# Patient Record
Sex: Male | Born: 1950 | Race: White | Hispanic: No | Marital: Married | Smoking: Current every day smoker
Health system: Southern US, Community
[De-identification: ages and names within clinical notes are randomized; demographics above are authoritative.]

## PROBLEM LIST (undated history)

## (undated) DIAGNOSIS — N200 Calculus of kidney: Secondary | ICD-10-CM

## (undated) DIAGNOSIS — M48061 Spinal stenosis, lumbar region without neurogenic claudication: Secondary | ICD-10-CM

## (undated) DIAGNOSIS — J410 Simple chronic bronchitis: Secondary | ICD-10-CM

## (undated) DIAGNOSIS — F988 Other specified behavioral and emotional disorders with onset usually occurring in childhood and adolescence: Secondary | ICD-10-CM

## (undated) DIAGNOSIS — G709 Myoneural disorder, unspecified: Secondary | ICD-10-CM

## (undated) DIAGNOSIS — F419 Anxiety disorder, unspecified: Secondary | ICD-10-CM

## (undated) DIAGNOSIS — M1990 Unspecified osteoarthritis, unspecified site: Secondary | ICD-10-CM

## (undated) DIAGNOSIS — M5432 Sciatica, left side: Secondary | ICD-10-CM

## (undated) DIAGNOSIS — Z87442 Personal history of urinary calculi: Secondary | ICD-10-CM

## (undated) DIAGNOSIS — N189 Chronic kidney disease, unspecified: Secondary | ICD-10-CM

## (undated) DIAGNOSIS — R200 Anesthesia of skin: Secondary | ICD-10-CM

## (undated) DIAGNOSIS — N401 Enlarged prostate with lower urinary tract symptoms: Secondary | ICD-10-CM

## (undated) DIAGNOSIS — M722 Plantar fascial fibromatosis: Secondary | ICD-10-CM

## (undated) DIAGNOSIS — J439 Emphysema, unspecified: Secondary | ICD-10-CM

## (undated) DIAGNOSIS — E785 Hyperlipidemia, unspecified: Secondary | ICD-10-CM

## (undated) HISTORY — PX: OTHER SURGICAL HISTORY: SHX169

## (undated) HISTORY — PX: EXTRACORPOREAL SHOCK WAVE LITHOTRIPSY: SHX1557

## (undated) HISTORY — PX: CATARACT EXTRACTION W/ INTRAOCULAR LENS  IMPLANT, BILATERAL: SHX1307

---

## 2002-11-07 ENCOUNTER — Encounter (INDEPENDENT_AMBULATORY_CARE_PROVIDER_SITE_OTHER): Payer: Self-pay | Admitting: Specialist

## 2002-11-07 ENCOUNTER — Ambulatory Visit (HOSPITAL_COMMUNITY): Admission: RE | Admit: 2002-11-07 | Discharge: 2002-11-07 | Payer: Self-pay | Admitting: *Deleted

## 2009-08-25 ENCOUNTER — Emergency Department (HOSPITAL_COMMUNITY): Admission: EM | Admit: 2009-08-25 | Discharge: 2009-08-26 | Payer: Self-pay | Admitting: Emergency Medicine

## 2010-10-04 HISTORY — PX: KNEE ARTHROSCOPY: SHX127

## 2010-10-04 IMAGING — CT CT ABDOMEN W/O CM
2 of 3 series · 17 of 32 positions shown, 19 images · non-contrast
Comparison: None.

CT ABDOMEN

CLINICAL DATA: Right lower quadrant pain, nausea.

CT OF THE ABDOMEN AND PELVIS WITHOUT CONTRAST (CT UROGRAM)
TECHNIQUE: Multidetector CT imaging was performed through the
abdomen and pelvis to include the urinary tract.

[Series 4: lung windows · axial · 0.71mm/px · z∈[+1223,+1298]mm · 14 of 18 slices shown, 16 images]
[im 2/18  soft-tissue]
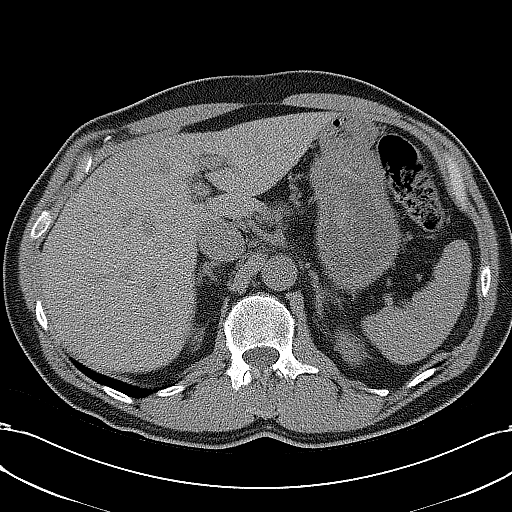
[im 2/18  bone]
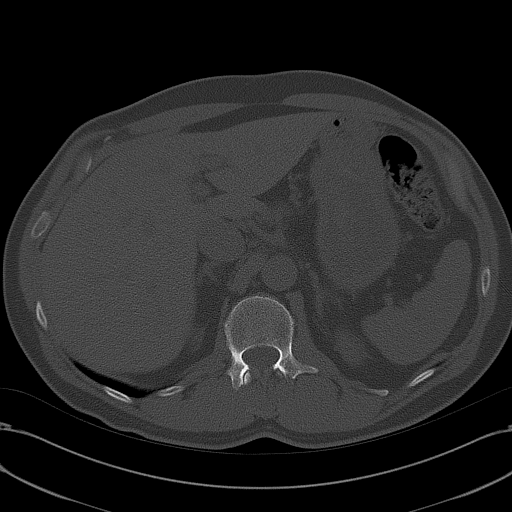
[im 3/18  soft-tissue]
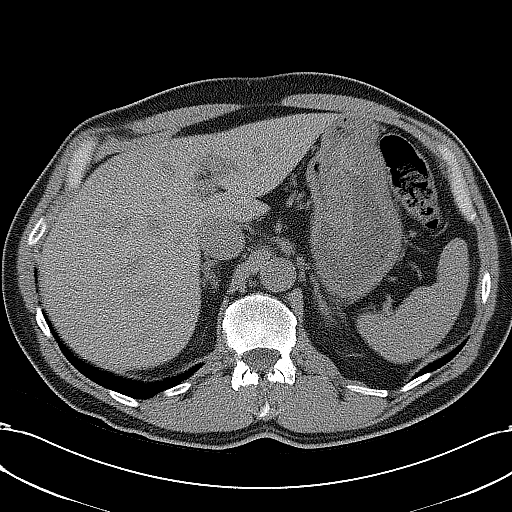
[im 4/18  soft-tissue]
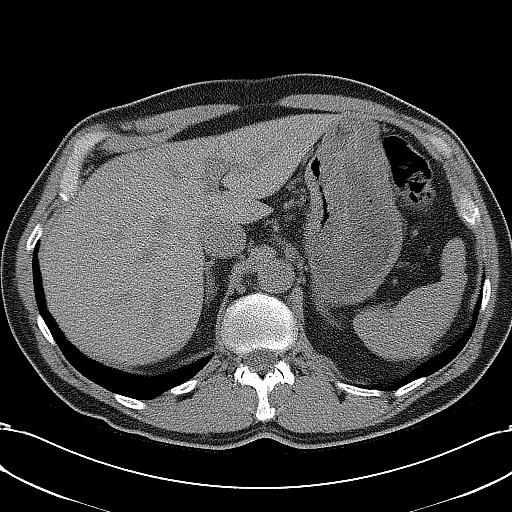
[im 6/18  soft-tissue]
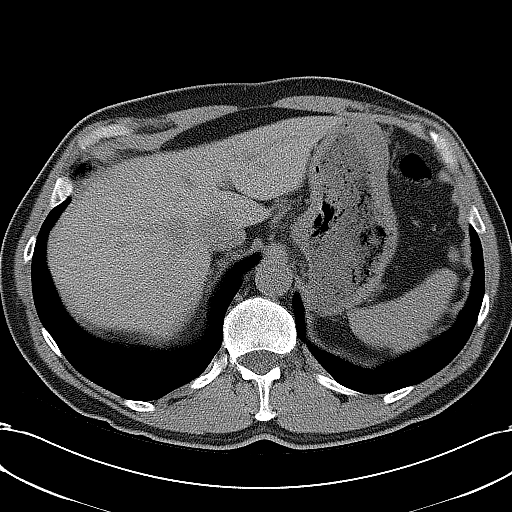
[im 7/18  soft-tissue]
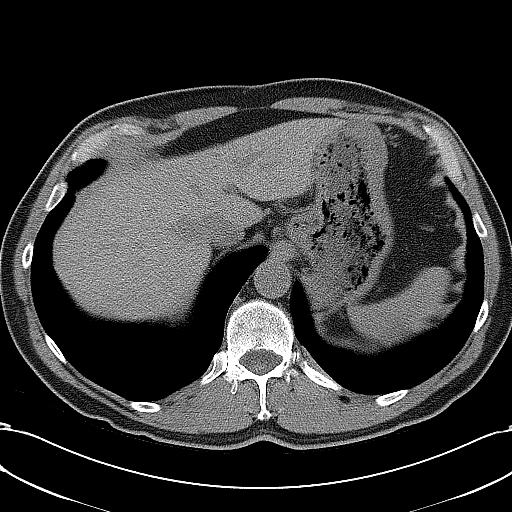
[im 8/18  soft-tissue]
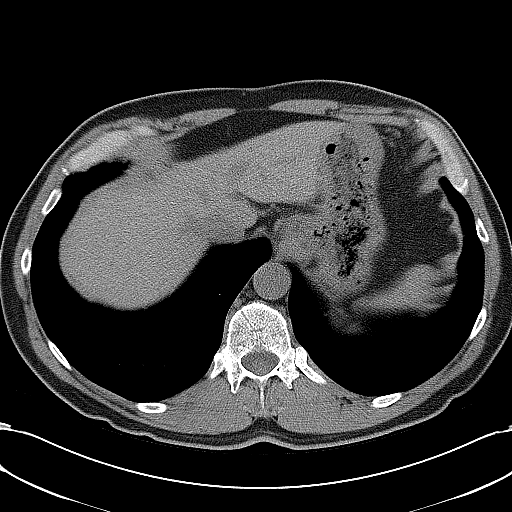
[im 9/18  soft-tissue]
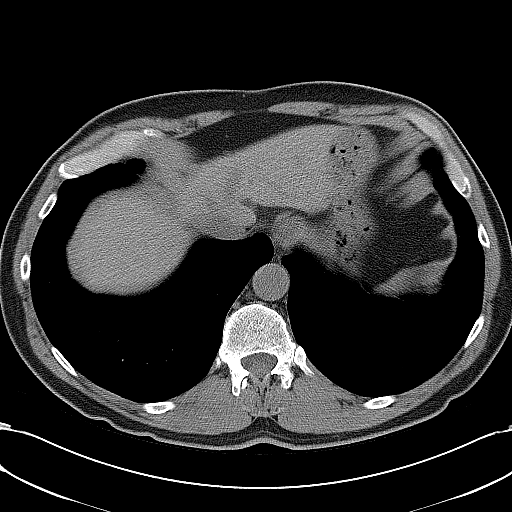
[im 10/18  soft-tissue]
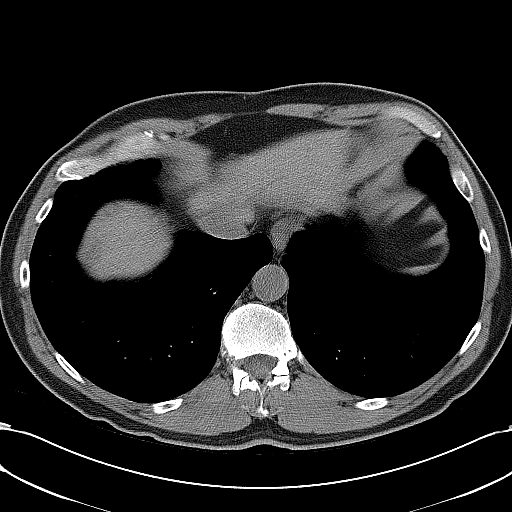
[im 11/18  soft-tissue]
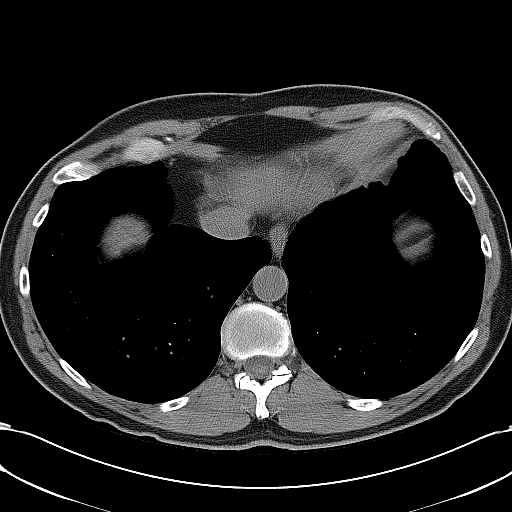
[im 11/18  bone]
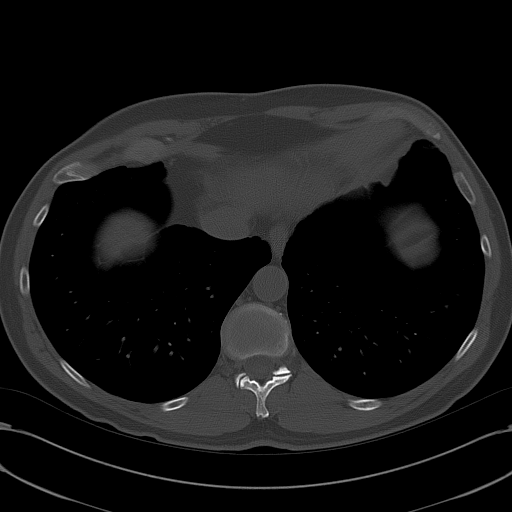
[im 12/18  soft-tissue]
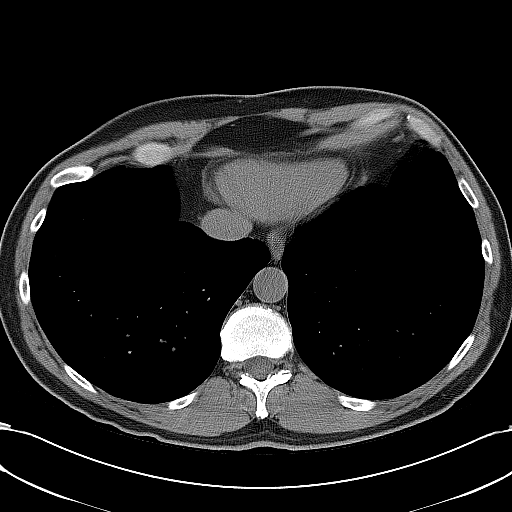
[im 14/18  soft-tissue]
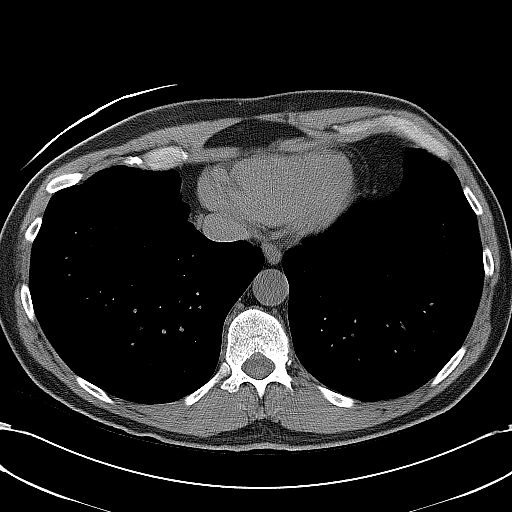
[im 15/18  soft-tissue]
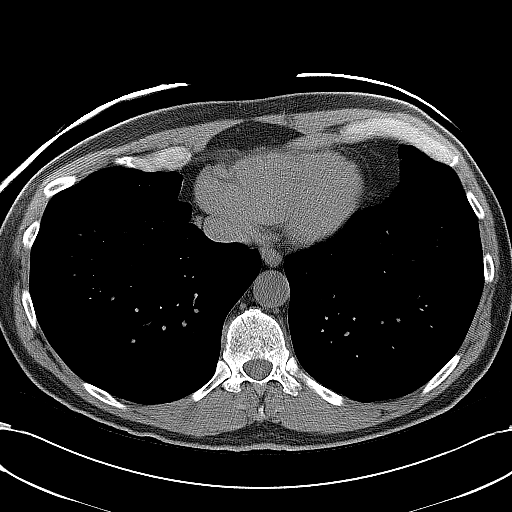
[im 16/18  soft-tissue]
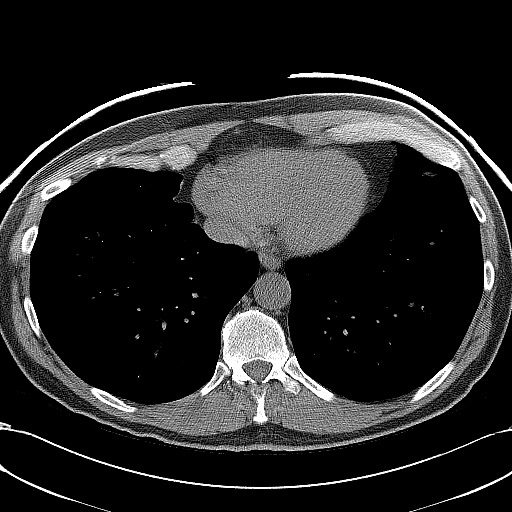
[im 17/18  soft-tissue]
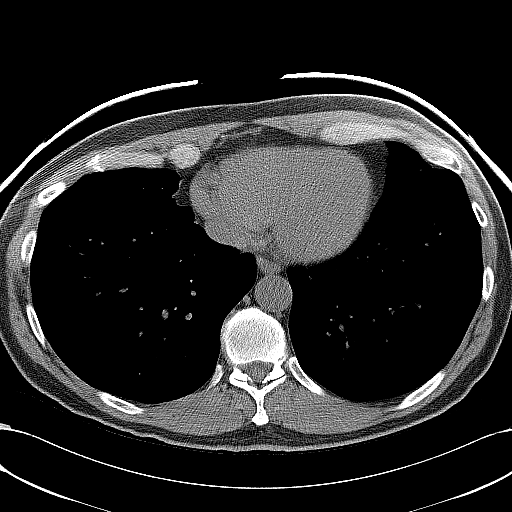

[Series 602: <mpr thick range> · coronal · 0.83mm/px · 3 of 84 slices shown]
[im 28/84  soft-tissue]
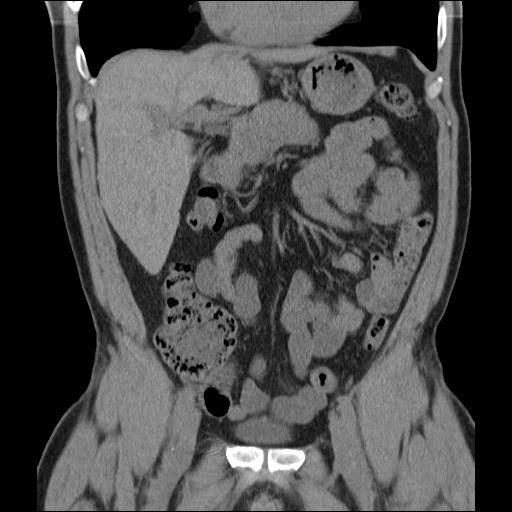
[im 37/84  soft-tissue]
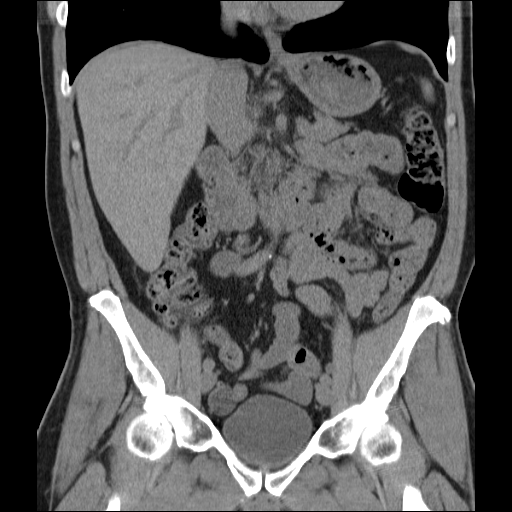
[im 47/84  soft-tissue]
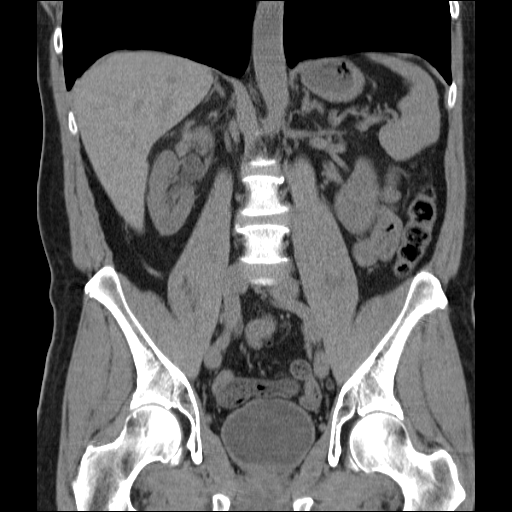

[17 of 32 positions shown; findings below may reference images not displayed]

FINDINGS: Lung bases are clear.  No effusions.  Heart is normal
size.

There is mild right hydronephrosis and hydroureter.  No
hydronephrosis on the left.  Small nonobstructing left mid pole
stone.  No proximal ureteral stones bilaterally.

Otherwise solid organs have an unremarkable unenhanced appearance.
Gallbladder and bowel grossly unremarkable.  No free fluid, free
air or adenopathy.  Normal retrocecal appendix.

No acute bony abnormality.
IMPRESSION: Mild right hydronephrosis.

Left nephrolithiasis.

Normal retrocecal appendix.

CT PELVIS
FINDINGS: 2 mm distal right ureteral stone at the right UVJ.
Right ureter is dilated to the stone.  Left ureter or bladder
unremarkable.

Mild prostate prominence. Bowel grossly unremarkable.  No free
fluid, free air, or adenopathy. No acute bony abnormality.
IMPRESSION: 2 mm right UVJ stone.

Mild prostate prominence.

## 2011-01-06 LAB — URINE MICROSCOPIC-ADD ON

## 2011-01-06 LAB — URINALYSIS, ROUTINE W REFLEX MICROSCOPIC
Ketones, ur: NEGATIVE mg/dL
Leukocytes, UA: NEGATIVE
Nitrite: NEGATIVE
Protein, ur: NEGATIVE mg/dL
pH: 5.5 (ref 5.0–8.0)

## 2012-06-08 ENCOUNTER — Encounter (HOSPITAL_COMMUNITY): Payer: Self-pay | Admitting: Pharmacy Technician

## 2012-06-08 ENCOUNTER — Encounter (HOSPITAL_COMMUNITY): Payer: Self-pay | Admitting: *Deleted

## 2012-06-08 ENCOUNTER — Other Ambulatory Visit: Payer: Self-pay | Admitting: Urology

## 2012-06-08 NOTE — Pre-Procedure Instructions (Signed)
Patient instructed npo after midnight except meds with a sip. To arrive in SS at 0530 with blue folder, picture ID, insurance info. Instructed to avoid taking any aspirin, ibuprofen etc prior to litho. To follow laxative instructions in blue folder.

## 2012-06-11 NOTE — H&P (Signed)
   History of Present Illness: Gary Camacho presented with left sided abd pain. A CT A/P revealed a 7 x 8 mm left prox ureteral stone. There were no other stones. It was visible on KUB and had made little progress over the past weeks. The patient elects intervention.   He is well today with no fever, chills, N/V. He has not passed a stone.   KUB toay - stone visible left proximal ureter  Past Medical History  Diagnosis Date  . Anxiety   . kidney stones   . Neuromuscular disorder    Past Surgical History  Procedure Date  . Arthroscopy of knee     Home Medications:  No prescriptions prior to admission   Allergies: No Known Allergies  History reviewed. No pertinent family history. Social History:  reports that he has been smoking Pipe.  He has never used smokeless tobacco. He reports that he drinks about 1.8 ounces of alcohol per week. He reports that he does not use illicit drugs.  ROS: A complete review of systems was performed.  All systems are negative except for pertinent findings as noted. @ROS @   Physical Exam:  Vital signs in last 24 hours:   General:  Alert and oriented, No acute distress HEENT: Normocephalic, atraumatic Neck: No JVD or lymphadenopathy Cardiovascular: Regular rate and rhythm Lungs: Regular rate and effort Abdomen: Soft, nontender, nondistended, no abdominal masses Back: No CVA tenderness Extremities: No edema Neurologic: Grossly intact  Laboratory Data:  No results found for this or any previous visit (from the past 24 hour(s)). No results found for this or any previous visit (from the past 240 hour(s)). Creatinine: No results found for this basename: CREATININE:7 in the last 168 hours  Impression/Assessment:  Left proximal ureteral stone  Plan:  I discussed with the patient the nature, potential benefits, risks and alternatives to Left ESWL, including side effects of the proposed treatment, the likelihood of the patient achieving the goals  of the procedure, and any potential problems that might occur during the procedure or recuperation. All questions answered. Patient elects to proceed.

## 2012-06-12 ENCOUNTER — Encounter (HOSPITAL_COMMUNITY): Payer: Self-pay | Admitting: *Deleted

## 2012-06-12 ENCOUNTER — Ambulatory Visit (HOSPITAL_COMMUNITY): Payer: PRIVATE HEALTH INSURANCE

## 2012-06-12 ENCOUNTER — Encounter (HOSPITAL_COMMUNITY)
Admission: RE | Disposition: A | Discharge status: Home / Self Care | Payer: Self-pay | Source: Ambulatory Visit | Attending: Urology

## 2012-06-12 ENCOUNTER — Ambulatory Visit (HOSPITAL_COMMUNITY)
Admission: RE | Admit: 2012-06-12 | Discharge: 2012-06-12 | Disposition: A | Discharge status: Home / Self Care | Payer: PRIVATE HEALTH INSURANCE | Source: Ambulatory Visit | Attending: Urology | Admitting: Urology

## 2012-06-12 DIAGNOSIS — F17200 Nicotine dependence, unspecified, uncomplicated: Secondary | ICD-10-CM | POA: Insufficient documentation

## 2012-06-12 DIAGNOSIS — N200 Calculus of kidney: Secondary | ICD-10-CM | POA: Insufficient documentation

## 2012-06-12 HISTORY — DX: Myoneural disorder, unspecified: G70.9

## 2012-06-12 HISTORY — DX: Chronic kidney disease, unspecified: N18.9

## 2012-06-12 HISTORY — DX: Anxiety disorder, unspecified: F41.9

## 2012-06-12 SURGERY — LITHOTRIPSY, ESWL
Anesthesia: LOCAL | Laterality: Left

## 2012-06-12 MED ORDER — DIPHENHYDRAMINE HCL 25 MG PO CAPS
25.0000 mg | ORAL_CAPSULE | ORAL | Status: AC
  Administered 2012-06-12: 25 mg via ORAL
  Filled 2012-06-12: qty 1

## 2012-06-12 MED ORDER — SODIUM CHLORIDE 0.9 % IV SOLN
INTRAVENOUS | Status: DC
  Administered 2012-06-12: 1000 mL via INTRAVENOUS

## 2012-06-12 MED ORDER — CEPHALEXIN 500 MG PO CAPS: 500.0000 mg | ORAL_CAPSULE | Freq: Two times a day (BID) | ORAL | Status: AC

## 2012-06-12 MED ORDER — CIPROFLOXACIN IN D5W 400 MG/200ML IV SOLN
400.0000 mg | INTRAVENOUS | Status: AC
  Administered 2012-06-12: 400 mg via INTRAVENOUS
  Filled 2012-06-12: qty 200

## 2012-06-12 MED ORDER — MORPHINE SULFATE 10 MG/ML IJ SOLN: 2.0000 mg | INTRAMUSCULAR | Status: DC | PRN

## 2012-06-12 MED ORDER — OXYCODONE-ACETAMINOPHEN 5-325 MG PO TABS: 2.0000 | ORAL_TABLET | Freq: Four times a day (QID) | ORAL | Status: AC | PRN

## 2012-06-12 MED ORDER — DIAZEPAM 5 MG PO TABS
10.0000 mg | ORAL_TABLET | ORAL | Status: AC
  Administered 2012-06-12: 10 mg via ORAL
  Filled 2012-06-12: qty 2

## 2012-06-12 MED ORDER — PROMETHAZINE HCL 25 MG/ML IJ SOLN: 12.5000 mg | INTRAMUSCULAR | Status: DC | PRN

## 2012-06-12 NOTE — Brief Op Note (Signed)
06/12/2012  8:08 AM  PATIENT:  Gary Camacho  60 y.o. male  PRE-OPERATIVE DIAGNOSIS:  Left Proximal Ureteral Stone  POST-OPERATIVE DIAGNOSIS: Left proximal ureteral stone  PROCEDURE:  Procedure(s) (LRB) with comments: EXTRACORPOREAL SHOCK WAVE LITHOTRIPSY (ESWL) (Left)  SURGEON:  Surgeon(s) and Role:    * Antony Haste, MD - Primary   ANESTHESIA:   IV sedation  PLAN OF CARE: Discharge to home after PACU  PATIENT DISPOSITION:  PACU - hemodynamically stable.   Delay start of Pharmacological VTE agent (>24hrs) due to surgical blood loss or risk of bleeding: yes

## 2012-06-13 ENCOUNTER — Encounter (HOSPITAL_COMMUNITY)
Admission: RE | Disposition: A | Discharge status: Home / Self Care | Payer: Self-pay | Source: Ambulatory Visit | Attending: Urology

## 2012-06-13 ENCOUNTER — Other Ambulatory Visit: Payer: Self-pay | Admitting: Urology

## 2012-06-13 ENCOUNTER — Ambulatory Visit (HOSPITAL_COMMUNITY): Payer: PRIVATE HEALTH INSURANCE | Admitting: Anesthesiology

## 2012-06-13 ENCOUNTER — Encounter (HOSPITAL_COMMUNITY): Payer: Self-pay | Admitting: *Deleted

## 2012-06-13 ENCOUNTER — Encounter (HOSPITAL_COMMUNITY): Payer: Self-pay | Admitting: Anesthesiology

## 2012-06-13 ENCOUNTER — Ambulatory Visit (HOSPITAL_COMMUNITY)
Admission: RE | Admit: 2012-06-13 | Discharge: 2012-06-13 | Disposition: A | Discharge status: Home / Self Care | Payer: PRIVATE HEALTH INSURANCE | Source: Ambulatory Visit | Attending: Urology | Admitting: Urology

## 2012-06-13 DIAGNOSIS — F17200 Nicotine dependence, unspecified, uncomplicated: Secondary | ICD-10-CM | POA: Insufficient documentation

## 2012-06-13 DIAGNOSIS — N1330 Unspecified hydronephrosis: Secondary | ICD-10-CM | POA: Insufficient documentation

## 2012-06-13 DIAGNOSIS — N201 Calculus of ureter: Secondary | ICD-10-CM | POA: Insufficient documentation

## 2012-06-13 HISTORY — PX: CYSTOSCOPY WITH URETEROSCOPY: SHX5123

## 2012-06-13 LAB — SURGICAL PCR SCREEN: Staphylococcus aureus: NEGATIVE

## 2012-06-13 SURGERY — CYSTOSCOPY WITH URETEROSCOPY
Anesthesia: General | Laterality: Left | Wound class: Clean Contaminated

## 2012-06-13 MED ORDER — PROPOFOL 10 MG/ML IV BOLUS
INTRAVENOUS | Status: DC | PRN
  Administered 2012-06-13: 200 mg via INTRAVENOUS

## 2012-06-13 MED ORDER — MORPHINE SULFATE 4 MG/ML IJ SOLN
INTRAMUSCULAR | Status: AC
  Administered 2012-06-13: 4 mg
  Filled 2012-06-13: qty 1

## 2012-06-13 MED ORDER — MORPHINE SULFATE 2 MG/ML IJ SOLN: 2.0000 mg | INTRAMUSCULAR | Status: AC | PRN

## 2012-06-13 MED ORDER — FENTANYL CITRATE 0.05 MG/ML IJ SOLN
25.0000 ug | INTRAMUSCULAR | Status: DC | PRN
  Administered 2012-06-13 (×3): 25 ug via INTRAVENOUS

## 2012-06-13 MED ORDER — INDIGOTINDISULFONATE SODIUM 8 MG/ML IJ SOLN
INTRAMUSCULAR | Status: AC
  Filled 2012-06-13: qty 5

## 2012-06-13 MED ORDER — LACTATED RINGERS IV SOLN: INTRAVENOUS | Status: DC

## 2012-06-13 MED ORDER — FENTANYL CITRATE 0.05 MG/ML IJ SOLN
INTRAMUSCULAR | Status: AC
  Filled 2012-06-13: qty 2

## 2012-06-13 MED ORDER — MUPIROCIN 2 % EX OINT
TOPICAL_OINTMENT | Freq: Two times a day (BID) | CUTANEOUS | Status: DC
  Filled 2012-06-13: qty 22

## 2012-06-13 MED ORDER — SODIUM CHLORIDE 0.9 % IV SOLN
INTRAVENOUS | Status: DC
  Administered 2012-06-13 (×2): via INTRAVENOUS

## 2012-06-13 MED ORDER — ONDANSETRON HCL 4 MG/2ML IJ SOLN
INTRAMUSCULAR | Status: DC | PRN
  Administered 2012-06-13: 4 mg via INTRAVENOUS

## 2012-06-13 MED ORDER — MORPHINE SULFATE 2 MG/ML IJ SOLN: 4.0000 mg | Freq: Once | INTRAMUSCULAR | Status: DC

## 2012-06-13 MED ORDER — SODIUM CHLORIDE 0.9 % IV SOLN: 4.0000 mg | Freq: Four times a day (QID) | INTRAVENOUS | Status: AC | PRN

## 2012-06-13 MED ORDER — HYDROMORPHONE HCL PF 1 MG/ML IJ SOLN: 0.2500 mg | INTRAMUSCULAR | Status: DC | PRN

## 2012-06-13 MED ORDER — SODIUM CHLORIDE 0.9 % IR SOLN
Status: DC | PRN
  Administered 2012-06-13: 3000 mL

## 2012-06-13 MED ORDER — MEPERIDINE HCL 50 MG/ML IJ SOLN: 6.2500 mg | INTRAMUSCULAR | Status: DC | PRN

## 2012-06-13 MED ORDER — CEFAZOLIN SODIUM 1-5 GM-% IV SOLN
INTRAVENOUS | Status: AC
  Filled 2012-06-13: qty 100

## 2012-06-13 MED ORDER — MORPHINE SULFATE 4 MG/ML IJ SOLN
INTRAMUSCULAR | Status: AC
  Filled 2012-06-13: qty 1

## 2012-06-13 MED ORDER — PROMETHAZINE HCL 25 MG/ML IJ SOLN: 6.2500 mg | INTRAMUSCULAR | Status: DC | PRN

## 2012-06-13 MED ORDER — LIDOCAINE HCL (CARDIAC) 20 MG/ML IV SOLN
INTRAVENOUS | Status: DC | PRN
  Administered 2012-06-13: 100 mg via INTRAVENOUS

## 2012-06-13 MED ORDER — FENTANYL CITRATE 0.05 MG/ML IJ SOLN
INTRAMUSCULAR | Status: DC | PRN
  Administered 2012-06-13 (×4): 50 ug via INTRAVENOUS

## 2012-06-13 MED ORDER — MIDAZOLAM HCL 5 MG/5ML IJ SOLN
INTRAMUSCULAR | Status: DC | PRN
  Administered 2012-06-13: 2 mg via INTRAVENOUS

## 2012-06-13 MED ORDER — LIDOCAINE HCL 2 % EX GEL
CUTANEOUS | Status: AC
  Filled 2012-06-13: qty 10

## 2012-06-13 MED ORDER — LACTATED RINGERS IV SOLN
INTRAVENOUS | Status: DC | PRN
  Administered 2012-06-13: 17:00:00 via INTRAVENOUS

## 2012-06-13 MED ORDER — CEPHALEXIN 500 MG PO CAPS: 500.0000 mg | ORAL_CAPSULE | Freq: Four times a day (QID) | ORAL | Status: AC

## 2012-06-13 MED ORDER — IOHEXOL 300 MG/ML  SOLN
INTRAMUSCULAR | Status: DC | PRN
  Administered 2012-06-13: 10 mL

## 2012-06-13 MED ORDER — MORPHINE SULFATE 2 MG/ML IJ SOLN: 4.0000 mg | INTRAMUSCULAR | Status: DC

## 2012-06-13 MED ORDER — SUCCINYLCHOLINE CHLORIDE 20 MG/ML IJ SOLN
INTRAMUSCULAR | Status: DC | PRN
  Administered 2012-06-13: 100 mg via INTRAVENOUS

## 2012-06-13 MED ORDER — CEFAZOLIN SODIUM-DEXTROSE 2-3 GM-% IV SOLR
INTRAVENOUS | Status: DC | PRN
  Administered 2012-06-13: 2 g via INTRAVENOUS

## 2012-06-13 MED ORDER — DEXAMETHASONE SODIUM PHOSPHATE 10 MG/ML IJ SOLN
INTRAMUSCULAR | Status: DC | PRN
  Administered 2012-06-13: 10 mg via INTRAVENOUS

## 2012-06-13 MED ORDER — LIDOCAINE HCL 2 % EX GEL
CUTANEOUS | Status: DC | PRN
  Administered 2012-06-13: 1

## 2012-06-13 MED ORDER — IOHEXOL 300 MG/ML  SOLN
INTRAMUSCULAR | Status: AC
  Filled 2012-06-13: qty 1

## 2012-06-13 SURGICAL SUPPLY — 19 items
ADAPTER CATH URET PLST 4-6FR (CATHETERS) IMPLANT
BAG URO CATCHER STRL LF (DRAPE) ×2 IMPLANT
BASKET LASER NITINOL 1.9FR (BASKET) ×2 IMPLANT
CATH INTERMIT  6FR 70CM (CATHETERS) ×2 IMPLANT
CATH URET 5FR 28IN CONE TIP (BALLOONS) ×1
CATH URET 5FR 70CM CONE TIP (BALLOONS) ×1 IMPLANT
CLOTH BEACON ORANGE TIMEOUT ST (SAFETY) ×2 IMPLANT
DRAPE CAMERA CLOSED 9X96 (DRAPES) ×2 IMPLANT
GLIDEWIRE 0.025 SS STRAIGHT (WIRE) ×2 IMPLANT
GLOVE BIOGEL M STRL SZ7.5 (GLOVE) ×2 IMPLANT
GOWN STRL NON-REIN LRG LVL3 (GOWN DISPOSABLE) ×2 IMPLANT
GOWN STRL REIN XL XLG (GOWN DISPOSABLE) ×2 IMPLANT
GUIDEWIRE STR DUAL SENSOR (WIRE) ×2 IMPLANT
MANIFOLD NEPTUNE II (INSTRUMENTS) ×2 IMPLANT
PACK CYSTO (CUSTOM PROCEDURE TRAY) ×2 IMPLANT
SHEATH ACCESS URETERAL 38CM (SHEATH) ×2 IMPLANT
STENT CONTOUR 6FRX26X.038 (STENTS) ×2 IMPLANT
TUBING CONNECTING 10 (TUBING) ×2 IMPLANT
WIRE COONS/BENSON .038X145CM (WIRE) IMPLANT

## 2012-06-13 NOTE — Progress Notes (Signed)
Arrived in short stay from Dr. Mena Goes office in extreme Left flank pain. Office called for orders

## 2012-06-13 NOTE — Anesthesia Preprocedure Evaluation (Signed)
Anesthesia Evaluation  Patient identified by MRN, date of birth, ID band Patient awake    Reviewed: Allergy & Precautions, H&P , NPO status , Patient's Chart, lab work & pertinent test results  Airway Mallampati: II TM Distance: >3 FB Neck ROM: Full    Dental No notable dental hx.    Pulmonary neg pulmonary ROS, Current Smoker,  breath sounds clear to auscultation  Pulmonary exam normal       Cardiovascular negative cardio ROS  Rhythm:Regular Rate:Normal     Neuro/Psych negative neurological ROS  negative psych ROS   GI/Hepatic negative GI ROS, Neg liver ROS,   Endo/Other  negative endocrine ROS  Renal/GU negative Renal ROS  negative genitourinary   Musculoskeletal negative musculoskeletal ROS (+)   Abdominal   Peds negative pediatric ROS (+)  Hematology negative hematology ROS (+)   Anesthesia Other Findings   Reproductive/Obstetrics negative OB ROS                           Anesthesia Physical Anesthesia Plan  ASA: II  Anesthesia Plan: General   Post-op Pain Management:    Induction: Intravenous  Airway Management Planned:   Additional Equipment:   Intra-op Plan:   Post-operative Plan: Extubation in OR  Informed Consent: I have reviewed the patients History and Physical, chart, labs and discussed the procedure including the risks, benefits and alternatives for the proposed anesthesia with the patient or authorized representative who has indicated his/her understanding and acceptance.   Dental advisory given  Plan Discussed with: CRNA  Anesthesia Plan Comments:         Anesthesia Quick Evaluation

## 2012-06-13 NOTE — Anesthesia Postprocedure Evaluation (Signed)
  Anesthesia Post-op Note  Patient: Gary Camacho  Procedure(s) Performed: Procedure(s) (LRB): CYSTOSCOPY WITH URETEROSCOPY (Left)  Patient Location: PACU  Anesthesia Type: General  Level of Consciousness: awake and alert   Airway and Oxygen Therapy: Patient Spontanous Breathing  Post-op Pain: mild  Post-op Assessment: Post-op Vital signs reviewed, Patient's Cardiovascular Status Stable, Respiratory Function Stable, Patent Airway and No signs of Nausea or vomiting  Post-op Vital Signs: stable  Complications: No apparent anesthesia complications

## 2012-06-13 NOTE — OR Nursing (Signed)
Left ureteral stones sent with Dr. Eskridge. 

## 2012-06-13 NOTE — H&P (View-Only) (Signed)
   History of Present Illness: Gary Camacho presented with left sided abd pain. A CT A/P revealed a 7 x 8 mm left prox ureteral stone. There were no other stones. It was visible on KUB and had made little progress over the past weeks. The patient elects intervention.   He is well today with no fever, chills, N/V. He has not passed a stone.   KUB toay - stone visible left proximal ureter  Past Medical History  Diagnosis Date  . Anxiety   . kidney stones   . Neuromuscular disorder    Past Surgical History  Procedure Date  . Arthroscopy of knee     Home Medications:  No prescriptions prior to admission   Allergies: No Known Allergies  History reviewed. No pertinent family history. Social History:  reports that he has been smoking Pipe.  He has never used smokeless tobacco. He reports that he drinks about 1.8 ounces of alcohol per week. He reports that he does not use illicit drugs.  ROS: A complete review of systems was performed.  All systems are negative except for pertinent findings as noted. @ROS@   Physical Exam:  Vital signs in last 24 hours:   General:  Alert and oriented, No acute distress HEENT: Normocephalic, atraumatic Neck: No JVD or lymphadenopathy Cardiovascular: Regular rate and rhythm Lungs: Regular rate and effort Abdomen: Soft, nontender, nondistended, no abdominal masses Back: No CVA tenderness Extremities: No edema Neurologic: Grossly intact  Laboratory Data:  No results found for this or any previous visit (from the past 24 hour(s)). No results found for this or any previous visit (from the past 240 hour(s)). Creatinine: No results found for this basename: CREATININE:7 in the last 168 hours  Impression/Assessment:  Left proximal ureteral stone  Plan:  I discussed with the patient the nature, potential benefits, risks and alternatives to Left ESWL, including side effects of the proposed treatment, the likelihood of the patient achieving the goals  of the procedure, and any potential problems that might occur during the procedure or recuperation. All questions answered. Patient elects to proceed.   

## 2012-06-13 NOTE — Interval H&P Note (Signed)
History and Physical Interval Note:  06/13/2012 4:36 PM Please see office note. Pt has uncontrolled pain, nausea and emesis this afternoon. KUB - fragments appear to be lined up at left UVJ.  Gary Camacho  has presented today for surgery, with the diagnosis of Left Ureteral Stone  The various methods of treatment have been discussed with the patient and family. After consideration of risks, benefits and other options for treatment, the patient has consented to  Procedure(s) (LRB) with comments: CYSTOSCOPY WITH URETEROSCOPY (Left), STENT placement as a surgical intervention .  The patient's history has been reviewed, patient examined, no change in status, stable for surgery.  I have reviewed the patient's chart and labs.  Questions were answered to the patient's satisfaction.     Antony Haste

## 2012-06-13 NOTE — Transfer of Care (Signed)
Immediate Anesthesia Transfer of Care Note  Patient: Gary Camacho  Procedure(s) Performed: Procedure(s) (LRB) with comments: CYSTOSCOPY WITH URETEROSCOPY (Left) - Left retrograde pyelogram, Basket extraction left ureteral stone, Insertion Left double J stent  Patient Location: PACU  Anesthesia Type: General  Level of Consciousness: sedated  Airway & Oxygen Therapy: Patient Spontanous Breathing and Patient connected to face mask oxygen  Post-op Assessment: Report given to PACU RN and Post -op Vital signs reviewed and stable  Post vital signs: Reviewed and stable  Complications: No apparent anesthesia complications

## 2012-06-13 NOTE — Op Note (Signed)
Preoperative diagnosis: Left ureteral stone, left hydronephrosis, emesis, flank pain  Postoperative diagnosis: Same  Procedure: Cystoscopy Left retrograde pyelogram Left ureteroscopy and stone basket extraction Left ureteral stent placement  Surgeon: Mena Goes  Anesthesia: Gen.  Findings: Cystoscopy-the urethra was normal, and prostatic urethra was elongated with trilobar hypertrophy and a median lobe. This caused J. hooking of the ureters. The bladder was free of stone, foreign body or carcinoma.  Left retrograde pyelogram - this outlined multiple filling defects in the dilated distal and proximal ureter. The collecting system and renal pelvis appeared normal without filling defect and with mild dilation.  Description of procedure: After consent was obtained patient brought to the operating room. A timeout was performed to confirm the patient and procedure. After adequate anesthesia he was prepped and draped in the usual fashion. A rigid cystoscope was passed per urethra into the bladder. The left ureteral orifice was visualized and somewhat difficult to access due to to the median lobe. A 6 [place] open-ended catheter could not be used for retrograde therefore can't catheter was used to cannulate the left ureteral orifice and retrograde injection of contrast was performed. Next using a 6 [place] open-ended catheter to guide a sensor wire the sensor wire was advanced into the distal ureter but would not advance further. Therefore the 6 French catheter was advanced over the wire but would only access the distal ureter. The wire was removed and retrograde injection of contrast was performed again but this confirmed extravasation and a false passage. Therefore the 6 [place] open-ended catheter was removed. Starting again an angled glide wire was used to counteract the J. hooking of the ureter and I was able to access the true lumen. Initially the angle Glidewire would not go do to an obstructing stone  fragment that was impacted. After this fragment was pushed in by the wire copious amounts of proteinaceous urine  began spewing from the left ureteral orifice. The Glidewire was then advanced up and coiled in the kidney. The 6 [place] open-ended was advanced up to the region of the renal pelvis and the wire removed. Retrograde injection of contrast was performed again to confirm proper location and the collecting system which was confirmed as dictated above. Using the inner cannula of an access sheath the left ureteral orifice was gently dilated without difficulty. A rigid ureteroscope was advanced into the distal ureter were multiple stone fragments were found. These were all removed in their entirety with an escape basket. I could not get the rigid scope up over the iliac vessels to inspect the proximal ureter and did not attempt to force this. Having clear the distal ureter the scope was removed. The wire was confirmed to be in the ureteral lumen and through the ureteral orifice visually as well. The wire was backloaded on the cystoscope and a 6 x 26 cm stent was advanced. On fluoroscopic and cystoscopic guidance there were good coils in both places. The bladder was drained and the patient was awakened taken to recovery room in stable condition.  Drains: 6 x 26 cm left double-J ureteral stent without string  Complications: None  Estimated blood loss: Minimal  Specimens: Stone fragments to office lab  Disposition patient stable to PACU.

## 2012-06-14 ENCOUNTER — Encounter (HOSPITAL_COMMUNITY): Payer: Self-pay | Admitting: Urology

## 2014-05-24 ENCOUNTER — Other Ambulatory Visit: Payer: Self-pay | Admitting: Urology

## 2014-05-24 ENCOUNTER — Encounter (HOSPITAL_COMMUNITY): Payer: Self-pay | Admitting: Pharmacy Technician

## 2014-05-24 NOTE — Patient Instructions (Signed)
Gary Camacho  05/24/2014   Your procedure is scheduled on:  05/28/14    Report to [facility].  Follow the Signs to Short Stay Center at  1100      am  Call this number if you have problems the morning of surgery: [phone]   Remember:   Do not eat food or drink liquids after midnight.   Take these medicines the morning of surgery with A SIP OF WATER:    Do not wear jewelry  Do not wear lotions, powders, or perfumes., deodorant                  Men may shave face and neck.  Do not bring valuables to the hospital.  Contacts, dentures or bridgework may not be worn into surgery.   For patients adm   Patients discharged the day of surgery will not be allowed to drive  home.  Name and phone number of your driver:     Please read over the following fact sheets that you were given: [facility] - Preparing for Surgery Before surgery, you can play an important role.  Because skin is not sterile, your skin needs to be as free of germs as possible.  You can reduce the number of germs on your skin by washing with CHG (chlorahexidine gluconate) soap before surgery.  CHG is an antiseptic cleaner which kills germs and bonds with the skin to continue killing germs even after washing. Please DO NOT use if you have an allergy to CHG or antibacterial soaps.  If your skin becomes reddened/irritated stop using the CHG and inform your nurse when you arrive at Short Stay. Do not shave (including legs and underarms) for at least 48 hours prior to the first CHG shower.  You may shave your face/neck. Please follow these instructions carefully:  1.  Shower with CHG Soap the night before surgery and the  morning of Surgery.  2.  If you choose to wash your hair, wash your hair first as usual with your  normal  shampoo.  3.  After you shampoo, rinse your hair and body thoroughly to remove the  shampoo.                           4.  Use CHG as you would any other liquid soap.  You can apply chg directly   to the skin and wash                       Gently with a scrungie or clean washcloth.  5.  Apply the CHG Soap to your body ONLY FROM THE NECK DOWN.   Do not use on face/ open                           Wound or open sores. Avoid contact with eyes, ears mouth and genitals (private parts).                       Wash face,  Genitals (private parts) with your normal soap.             6.  Wash thoroughly, paying special attention to the area where your surgery  will be performed.  7.  Thoroughly rinse your body with warm water from the neck down.  8.  DO NOT shower/wash with your normal soap  after using and rinsing off  the CHG Soap.                9.  Pat yourself dry with a clean towel.            10.  Wear clean pajamas.            11.  Place clean sheets on your bed the night of your first shower and do not  sleep with pets. Day of Surgery : Do not apply any lotions/deodorants the morning of surgery.  Please wear clean clothes to the hospital/surgery center.  FAILURE TO FOLLOW THESE INSTRUCTIONS MAY RESULT IN THE CANCELLATION OF YOUR SURGERY PATIENT SIGNATURE_________________________________  NURSE SIGNATURE__________________________________  ________________________________________________________________________  coughing and deep breathing exercises, leg exercises

## 2014-05-24 NOTE — Progress Notes (Signed)
Lossie FaesPam Gibson called and made awarethat orders are under Signed and Held and need to be signed off by MD.

## 2014-05-27 ENCOUNTER — Encounter (HOSPITAL_COMMUNITY)
Admission: RE | Admit: 2014-05-27 | Discharge: 2014-05-27 | Disposition: A | Discharge status: Home / Self Care | Payer: 59 | Source: Ambulatory Visit | Attending: Urology | Admitting: Urology

## 2014-05-27 ENCOUNTER — Encounter (INDEPENDENT_AMBULATORY_CARE_PROVIDER_SITE_OTHER): Payer: Self-pay

## 2014-05-27 ENCOUNTER — Encounter (HOSPITAL_COMMUNITY): Payer: Self-pay

## 2014-05-27 DIAGNOSIS — N200 Calculus of kidney: Secondary | ICD-10-CM | POA: Diagnosis not present

## 2014-05-27 DIAGNOSIS — F17200 Nicotine dependence, unspecified, uncomplicated: Secondary | ICD-10-CM | POA: Diagnosis not present

## 2014-05-27 DIAGNOSIS — Z7982 Long term (current) use of aspirin: Secondary | ICD-10-CM | POA: Diagnosis not present

## 2014-05-27 DIAGNOSIS — Z79899 Other long term (current) drug therapy: Secondary | ICD-10-CM | POA: Diagnosis not present

## 2014-05-27 HISTORY — DX: Unspecified osteoarthritis, unspecified site: M19.90

## 2014-05-27 HISTORY — DX: Other specified behavioral and emotional disorders with onset usually occurring in childhood and adolescence: F98.8

## 2014-05-27 HISTORY — DX: Plantar fascial fibromatosis: M72.2

## 2014-05-27 HISTORY — DX: Spinal stenosis, lumbar region without neurogenic claudication: M48.061

## 2014-05-27 LAB — CBC
HCT: 46.5 % (ref 39.0–52.0)
Hemoglobin: 15.6 g/dL (ref 13.0–17.0)
MCH: 30.5 pg (ref 26.0–34.0)
MCHC: 33.5 g/dL (ref 30.0–36.0)
MCV: 91 fL (ref 78.0–100.0)
PLATELETS: 212 10*3/uL (ref 150–400)
RBC: 5.11 MIL/uL (ref 4.22–5.81)
RDW: 13.3 % (ref 11.5–15.5)
WBC: 5.6 10*3/uL (ref 4.0–10.5)

## 2014-05-27 LAB — BASIC METABOLIC PANEL
ANION GAP: 11 (ref 5–15)
BUN: 21 mg/dL (ref 6–23)
CALCIUM: 10 mg/dL (ref 8.4–10.5)
CO2: 28 mEq/L (ref 19–32)
Chloride: 101 mEq/L (ref 96–112)
Creatinine, Ser: 1.11 mg/dL (ref 0.50–1.35)
GFR calc Af Amer: 81 mL/min — ABNORMAL LOW (ref >90)
GFR, EST NON AFRICAN AMERICAN: 70 mL/min — AB (ref >90)
Glucose, Bld: 94 mg/dL (ref 70–99)
Potassium: 4.9 mEq/L (ref 3.7–5.3)
Sodium: 140 mEq/L (ref 137–147)

## 2014-05-27 NOTE — H&P (Signed)
History of Present Illness F/u -    1- nephrolthiasis.   -2012 stone risk profile, this showed low volume of 1.67 L. High urinary calcium at 233, normal oxalate and citrate --> stones are CaOX  -Sept 2013 ESWL - 7 x 8 mm left proximal ureteral stone. On 10 mg pain meds pre-op - limited the efficacy of ESWL.   -Sept 10, 2013 KUB one fragment fallen into LLP. Several small fragment lined up at left UVJ --> left ureteroscopy and stent and returned for cystoscopy stent removal (KUB a LLP fragment 3-4 mm)  -Nov 2013 [place] - both kidneys appear normal without hydronephrosis or mass. There were several echogenic foci in the left kidney ranging from 2 mm to 6 mm. Postvoid residual was 65 mL. The bladder appeared normal without stone. The ureters were not seen. The prostate was mildly enlarged with an intravesical median lobe.   -June 2014 - KUB -- comparison KUB 2013 -- stable LLP stone, normal bowel gas and bones.   -June 2015 mild intermittent left flank pain. KUB today equivocal due to bowel gas covering left side. No other stones noted. Renal ultrasound without hydronephrosis and a stable left mid pole stone.      2- BPH  Flomax started taking in 2010 improved his stream. He saw Dr. Wanda Plump for some prostatitis in 2004. He gets up an average of 2 times a night he does have some hesitancy. PSA November 2010 was 1.56 and 1.36 March 2012. He tried to discontinue the Flomax but his stream was much worse even taking it every other day.   2012- PVR 87   -June 2013 DRE normal, BPH, PSA 2.8.   -Nov 2013 large prostate on [place], median lobe  -June 2014 increased tamsulosin BID - weak stream; PSA 2.28  -June 2015 - PSA 2.4, tamsulosin 0.4 mg twice a day. He said no bothersome lower urinary tract symptoms. He's had no dysuria or gross hematuria.  -Jul 2015 PVR 150 mL      3-MH - June 2015 - noted on and off for several years. Pt is a smoker (cigs now pipe). He underwent Sept 2013 -  cystoscopy normal, done for left ureteral stones. Nov 2013 - normal renal U/S.      Aug 2015 interval hx  Patient returns because he has had continued left flank pain. CT scan of the abdomen and pelvis was obtained which shows a 12 mm left UPJ stone without hydronephrosis. The skin to stone distance is 8 cm and the Hounsfield units were about 1000. It does appear to be faintly visible on the scalp and looking back at the KUB likely visible. Surprisingly was not seen on the ultrasound.          Past Medical History Problems  1. History of Abdominal pain, RLQ (right lower quadrant) (789.03) 2. History of Attention Deficit Disorder Without Hyperactivity (314.00) 3. History of Calculus of ureter (592.1) 4. History of diverticulitis of colon (V12.79) 5. History of hypercholesterolemia (V12.29) 6. History of kidney stones (V13.01)  Surgical History Problems  1. History of Arthroscopy Knee Left 2. History of Cataract Surgery 3. History of Complete Colonoscopy For Polyp Removal 4. History of Cystoscopy With Insertion Of Ureteral Stent Left 5. History of Cystoscopy With Ureteroscopy With Removal Of Calculus 6. History of Lithotripsy 7. History of No Surgical Problems  Current Meds 1. Aspirin 81 MG Oral Tablet;  Therapy: (Recorded:29Nov2010) to Recorded 2. Concerta 54 MG Oral Tablet Extended Release;  Therapy: (Recorded:29Nov2010)  to Recorded 3. Etodolac 500 MG Oral Tablet;  Therapy: 23Oct2013 to Recorded 4. Tamsulosin HCl - 0.4 MG Oral Capsule; TAKE 1 TO 2 CAPSULES BY MOUTH EVERY  DAY;  Therapy: 21Aug2013 to (Evaluate:19Jun2016)  Requested for: 25Jun2015; Last  Rx:25Jun2015 Ordered  Allergies Medication  1. No Known Drug Allergies  Family History Problems  1. Family history of Death of family member : Father   Pt is adopted. 2. Family history of Family Health Status - Father's Age   106 3. Family history of Family Health Status - Mother's Age   13 4. Family  history of Family Health Status Number Of Children   adopted children 1 daughter (7)  1 son (4)  Social History Problems  1. Alcohol Use   1 per wk 2. Caffeine Use   5 per day 3. Current every day smoker (305.1)   smokes a pipe, x 20 yrs 4. Marital History - Currently Married 5. Occupation:   Mental Health Counseler 6. Tobacco use (305.1)   pipe smoker since 1994  Vitals Vital Signs [Data Includes: Last 1 Day]  Recorded: 20Aug2015 11:12AM  Height: 6 ft 1 in Weight: 187 lb  BMI Calculated: 24.67 BSA Calculated: 2.09 Blood Pressure: 141 / 83 Temperature: 97.9 F Heart Rate: 72  Physical Exam Constitutional: Well nourished and well developed . No acute distress.  Neuro/Psych:. Mood and affect are appropriate.    Results/Data Urine [Data Includes: Last 1 Day]   20Aug2015  COLOR YELLOW   APPEARANCE CLEAR   SPECIFIC GRAVITY 1.020   pH 6.5   GLUCOSE NEG mg/dL  BILIRUBIN LARGE   KETONE NEG mg/dL  BLOOD MOD   PROTEIN TRACE mg/dL  UROBILINOGEN 0.2 mg/dL  NITRITE NEG   LEUKOCYTE ESTERASE TRACE   SQUAMOUS EPITHELIAL/HPF RARE   WBC 3-6 WBC/hpf  RBC 11-20 RBC/hpf  BACTERIA RARE   CRYSTALS NONE SEEN   CASTS NONE SEEN   Other MUCUS NOTED    Old records or history reviewed:Marland Kitchen    Assessment Assessed  1. Kidney stone on left side (592.0)  Plan Kidney stone on left side  1. Start: Oxycodone-Acetaminophen 5-325 MG Oral Tablet; TAKE 1 TO 2 TABLETS EVERY 4  TO 6 HOURS AS NEEDED FOR PAIN 2. Follow-up Schedule Surgery Office  Follow-up  Status: Hold For - Appointment   Requested for: 20Aug2015  Discussion/Summary Left renal stones-I discussed the patient with Diane and he wanted to see me personally. We discussed the CT findings and the nature risks benefits of continued surveillance, shockwave lithotripsy, ureteroscopy. All questions answered. He did not do well with shockwave lithotripsy last time and he did ureteroscopy so he elects to proceed with ureteroscopy. He  understands he may need a stent and a stage procedure. Discussed the risk of ureteral injury among others.     Signatures Electronically signed by : Jerilee Field, M.D.; May 23 2014  3:23PM EST

## 2014-05-28 ENCOUNTER — Ambulatory Visit (HOSPITAL_COMMUNITY)
Admission: RE | Admit: 2014-05-28 | Discharge: 2014-05-28 | Disposition: A | Discharge status: Home / Self Care | Payer: 59 | Source: Ambulatory Visit | Attending: Urology | Admitting: Urology

## 2014-05-28 ENCOUNTER — Encounter (HOSPITAL_COMMUNITY)
Admission: RE | Disposition: A | Discharge status: Home / Self Care | Payer: Self-pay | Source: Ambulatory Visit | Attending: Urology

## 2014-05-28 ENCOUNTER — Encounter (HOSPITAL_COMMUNITY): Payer: Self-pay | Admitting: *Deleted

## 2014-05-28 ENCOUNTER — Encounter (HOSPITAL_COMMUNITY): Payer: Self-pay

## 2014-05-28 ENCOUNTER — Encounter (HOSPITAL_COMMUNITY): Payer: 59 | Admitting: Anesthesiology

## 2014-05-28 ENCOUNTER — Ambulatory Visit (HOSPITAL_COMMUNITY): Payer: 59 | Admitting: Anesthesiology

## 2014-05-28 DIAGNOSIS — N200 Calculus of kidney: Secondary | ICD-10-CM | POA: Diagnosis not present

## 2014-05-28 DIAGNOSIS — Z7982 Long term (current) use of aspirin: Secondary | ICD-10-CM | POA: Insufficient documentation

## 2014-05-28 DIAGNOSIS — Z79899 Other long term (current) drug therapy: Secondary | ICD-10-CM | POA: Insufficient documentation

## 2014-05-28 DIAGNOSIS — F17200 Nicotine dependence, unspecified, uncomplicated: Secondary | ICD-10-CM | POA: Insufficient documentation

## 2014-05-28 HISTORY — PX: CYSTOSCOPY WITH URETEROSCOPY AND STENT PLACEMENT: SHX6377

## 2014-05-28 HISTORY — PX: HOLMIUM LASER APPLICATION: SHX5852

## 2014-05-28 SURGERY — CYSTOURETEROSCOPY, WITH STENT INSERTION
Anesthesia: General | Laterality: Left

## 2014-05-28 MED ORDER — MEPERIDINE HCL 50 MG/ML IJ SOLN: 6.2500 mg | INTRAMUSCULAR | Status: DC | PRN

## 2014-05-28 MED ORDER — FENTANYL CITRATE 0.05 MG/ML IJ SOLN
INTRAMUSCULAR | Status: AC
  Filled 2014-05-28: qty 2

## 2014-05-28 MED ORDER — PROPOFOL 10 MG/ML IV BOLUS
INTRAVENOUS | Status: AC
  Filled 2014-05-28: qty 20

## 2014-05-28 MED ORDER — ONDANSETRON HCL 4 MG/2ML IJ SOLN
INTRAMUSCULAR | Status: AC
  Filled 2014-05-28: qty 2

## 2014-05-28 MED ORDER — ONDANSETRON HCL 4 MG/2ML IJ SOLN
INTRAMUSCULAR | Status: DC | PRN
  Administered 2014-05-28: 4 mg via INTRAVENOUS

## 2014-05-28 MED ORDER — FENTANYL CITRATE 0.05 MG/ML IJ SOLN
INTRAMUSCULAR | Status: DC | PRN
  Administered 2014-05-28 (×2): 50 ug via INTRAVENOUS

## 2014-05-28 MED ORDER — LACTATED RINGERS IV SOLN: INTRAVENOUS | Status: DC

## 2014-05-28 MED ORDER — KETOROLAC TROMETHAMINE 30 MG/ML IJ SOLN
INTRAMUSCULAR | Status: DC | PRN
  Administered 2014-05-28: 30 mg via INTRAVENOUS

## 2014-05-28 MED ORDER — FENTANYL CITRATE 0.05 MG/ML IJ SOLN
25.0000 ug | INTRAMUSCULAR | Status: DC | PRN
  Administered 2014-05-28: 50 ug via INTRAVENOUS

## 2014-05-28 MED ORDER — LIDOCAINE HCL (CARDIAC) 20 MG/ML IV SOLN
INTRAVENOUS | Status: DC | PRN
  Administered 2014-05-28: 100 mg via INTRAVENOUS

## 2014-05-28 MED ORDER — CEFAZOLIN SODIUM-DEXTROSE 2-3 GM-% IV SOLR
INTRAVENOUS | Status: AC
  Filled 2014-05-28: qty 50

## 2014-05-28 MED ORDER — PROPOFOL 10 MG/ML IV BOLUS
INTRAVENOUS | Status: DC | PRN
  Administered 2014-05-28: 200 mg via INTRAVENOUS

## 2014-05-28 MED ORDER — IOHEXOL 300 MG/ML  SOLN
INTRAMUSCULAR | Status: DC | PRN
  Administered 2014-05-28: 5 mL

## 2014-05-28 MED ORDER — DEXAMETHASONE SODIUM PHOSPHATE 10 MG/ML IJ SOLN
INTRAMUSCULAR | Status: AC
  Filled 2014-05-28: qty 1

## 2014-05-28 MED ORDER — MIDAZOLAM HCL 2 MG/2ML IJ SOLN
INTRAMUSCULAR | Status: AC
  Filled 2014-05-28: qty 2

## 2014-05-28 MED ORDER — SODIUM CHLORIDE 0.9 % IR SOLN
Status: DC | PRN
  Administered 2014-05-28: 4000 mL

## 2014-05-28 MED ORDER — KETOROLAC TROMETHAMINE 30 MG/ML IJ SOLN
INTRAMUSCULAR | Status: AC
  Filled 2014-05-28: qty 1

## 2014-05-28 MED ORDER — PROMETHAZINE HCL 25 MG/ML IJ SOLN: 6.2500 mg | INTRAMUSCULAR | Status: DC | PRN

## 2014-05-28 MED ORDER — LIDOCAINE HCL 2 % EX GEL
CUTANEOUS | Status: AC
  Filled 2014-05-28: qty 10

## 2014-05-28 MED ORDER — EPHEDRINE SULFATE 50 MG/ML IJ SOLN
INTRAMUSCULAR | Status: DC | PRN
  Administered 2014-05-28: 10 mg via INTRAVENOUS
  Administered 2014-05-28 (×2): 5 mg via INTRAVENOUS

## 2014-05-28 MED ORDER — DEXAMETHASONE SODIUM PHOSPHATE 10 MG/ML IJ SOLN
INTRAMUSCULAR | Status: DC | PRN
  Administered 2014-05-28: 10 mg via INTRAVENOUS

## 2014-05-28 MED ORDER — LIDOCAINE HCL (CARDIAC) 20 MG/ML IV SOLN
INTRAVENOUS | Status: AC
  Filled 2014-05-28: qty 5

## 2014-05-28 MED ORDER — MIDAZOLAM HCL 5 MG/5ML IJ SOLN
INTRAMUSCULAR | Status: DC | PRN
  Administered 2014-05-28: 2 mg via INTRAVENOUS

## 2014-05-28 MED ORDER — CEFAZOLIN SODIUM-DEXTROSE 2-3 GM-% IV SOLR
2.0000 g | INTRAVENOUS | Status: AC
  Administered 2014-05-28: 2 g via INTRAVENOUS

## 2014-05-28 MED ORDER — LACTATED RINGERS IV SOLN
INTRAVENOUS | Status: DC
  Administered 2014-05-28: 1000 mL via INTRAVENOUS
  Administered 2014-05-28: 15:00:00 via INTRAVENOUS

## 2014-05-28 SURGICAL SUPPLY — 19 items
BAG URO CATCHER STRL LF (DRAPE) ×3 IMPLANT
CATH INTERMIT  6FR 70CM (CATHETERS) ×3 IMPLANT
CATH URET 5FR 28IN CONE TIP (BALLOONS) ×2
CATH URET 5FR 70CM CONE TIP (BALLOONS) ×1 IMPLANT
CATH URET DUAL LUMEN 6-10FR 50 (CATHETERS) ×3 IMPLANT
CLOTH BEACON ORANGE TIMEOUT ST (SAFETY) ×3 IMPLANT
DRAPE CAMERA CLOSED 9X96 (DRAPES) ×3 IMPLANT
EXTRACTOR STONE NITINOL NGAGE (UROLOGICAL SUPPLIES) ×3 IMPLANT
FIBER LASER FLEXIVA 200 (UROLOGICAL SUPPLIES) ×3 IMPLANT
GLOVE BIO SURGEON STRL SZ7.5 (GLOVE) ×3 IMPLANT
GOWN STRL REUS W/TWL XL LVL3 (GOWN DISPOSABLE) ×3 IMPLANT
GUIDEWIRE STR DUAL SENSOR (WIRE) ×6 IMPLANT
MANIFOLD NEPTUNE II (INSTRUMENTS) ×3 IMPLANT
PACK CYSTO (CUSTOM PROCEDURE TRAY) ×3 IMPLANT
SHEATH ACCESS URETERAL 38CM (SHEATH) ×3 IMPLANT
STENT CONTOUR 6FRX26X.038 (STENTS) ×3 IMPLANT
TUBING CONNECTING 10 (TUBING) ×2 IMPLANT
TUBING CONNECTING 10' (TUBING) ×1
WIRE COONS/BENSON .038X145CM (WIRE) ×3 IMPLANT

## 2014-05-28 NOTE — Anesthesia Postprocedure Evaluation (Signed)
  Anesthesia Post-op Note  Patient: Gary Camacho  Procedure(s) Performed: Procedure(s) (LRB): LEFT URETEROSCOPY AND STENT PLACEMENT (Left) HOLMIUM LASER APPLICATION (Left)  Patient Location: PACU  Anesthesia Type: General  Level of Consciousness: awake and alert   Airway and Oxygen Therapy: Patient Spontanous Breathing  Post-op Pain: mild  Post-op Assessment: Post-op Vital signs reviewed, Patient's Cardiovascular Status Stable, Respiratory Function Stable, Patent Airway and No signs of Nausea or vomiting  Last Vitals:  Filed Vitals:   05/28/14 1558  BP: 140/80  Pulse: 65  Temp:   Resp: 18    Post-op Vital Signs: stable   Complications: No apparent anesthesia complications

## 2014-05-28 NOTE — Discharge Instructions (Signed)
Ureteral Stent Implantation, Care After Refer to this sheet in the next few weeks. These instructions provide you with information on caring for yourself after your procedure. Your health care provider may also give you more specific instructions. Your treatment has been planned according to current medical practices, but problems sometimes occur. Call your health care provider if you have any problems or questions after your procedure. WHAT TO EXPECT AFTER THE PROCEDURE You should be back to normal activity within 48 hours after the procedure. Nausea and vomiting may occur and are commonly the result of anesthesia. It is common to experience sharp pain in the back or lower abdomen and penis with voiding. This is caused by movement of the ends of the stent with the act of urinating.It usually goes away within minutes after you have stopped urinating.  HOME CARE INSTRUCTIONS Make sure to drink plenty of fluids. You may have small amounts of bleeding, causing your urine to be red. This is normal. Certain movements may trigger pain or a feeling that you need to urinate. You may be given medicines to prevent infection or bladder spasms. Be sure to take all medicines as directed. Only take over-the-counter or prescription medicines for pain, discomfort, or fever as directed by your health care provider. Do not take aspirin, as this can make bleeding worse.  STENT REMOVAL Remove the stent on Friday morning, 05/31/2014. Typically there is not a lot of discomfort with removing the stent, but take pain meds and ibuprofen before stent removal to help with spasms later.  Remove the stent by pulling the string was slow steady pressure into the entire stent is removed.  SEEK MEDICAL CARE IF:  You experience increasing pain.  Your pain medicine is not working. SEEK IMMEDIATE MEDICAL CARE IF:  Your urine is dark red or has blood clots.  You are leaking urine (incontinent).  You have a fever, chills,  feeling sick to your stomach (nausea), or vomiting.  Your pain is not relieved by pain medicine.  The end of the stent comes out of the urethra.  You are unable to urinate. Document Released: 05/23/2013 Document Revised: 09/25/2013 Document Reviewed: 05/23/2013 [facility] Patient Information 2015 [place], [place]. This information is not intended to replace advice given to you by your health care provider. Make sure you discuss any questions you have with your health care provider.

## 2014-05-28 NOTE — Transfer of Care (Signed)
Immediate Anesthesia Transfer of Care Note  Patient: Gary Camacho  Procedure(s) Performed: Procedure(s): LEFT URETEROSCOPY AND STENT PLACEMENT (Left) HOLMIUM LASER APPLICATION (Left)  Patient Location: PACU  Anesthesia Type:General  Level of Consciousness: awake and alert   Airway & Oxygen Therapy: Patient Spontanous Breathing and Patient connected to face mask oxygen  Post-op Assessment: Report given to PACU RN and Post -op Vital signs reviewed and stable  Post vital signs: Reviewed and stable  Complications: No apparent anesthesia complications

## 2014-05-28 NOTE — Interval H&P Note (Signed)
History and Physical Interval Note:  05/28/2014 12:52 PM  Gary Camacho  has presented today for surgery, with the diagnosis of LEFT RENAL STONES  The various methods of treatment have been discussed with the patient and family. After consideration of risks, benefits and other options for treatment, the patient has consented to  Procedure(s): LEFT URETEROSCOPY AND STENT PLACEMENT (Left) HOLMIUM LASER APPLICATION (Left) as a surgical intervention .  The patient's history has been reviewed, patient examined, no change in status, stable for surgery.  I have reviewed the patient's chart and labs. He is well today. No pain, dysuria, hematuria. Discussed possible staged procedure and the nature, potential benefits, risks and alternatives to left URS/HLL/stent, including side effects of the proposed treatment, the likelihood of the patient achieving the goals of the procedure, and any potential problems that might occur during the procedure or recuperation. All questions answered. Patient elects to proceed.     Jerilee Field

## 2014-05-28 NOTE — Anesthesia Preprocedure Evaluation (Signed)
Anesthesia Evaluation  Patient identified by MRN, date of birth, ID band Patient awake    Reviewed: Allergy & Precautions, H&P , NPO status , Patient's Chart, lab work & pertinent test results  Airway Mallampati: II TM Distance: >3 FB Neck ROM: Full    Dental no notable dental hx.    Pulmonary neg pulmonary ROS, Current Smoker,  breath sounds clear to auscultation  Pulmonary exam normal       Cardiovascular negative cardio ROS  Rhythm:Regular Rate:Normal     Neuro/Psych negative neurological ROS  negative psych ROS   GI/Hepatic negative GI ROS, Neg liver ROS,   Endo/Other  negative endocrine ROS  Renal/GU negative Renal ROS  negative genitourinary   Musculoskeletal negative musculoskeletal ROS (+)   Abdominal   Peds negative pediatric ROS (+)  Hematology negative hematology ROS (+)   Anesthesia Other Findings   Reproductive/Obstetrics negative OB ROS                           Anesthesia Physical  Anesthesia Plan  ASA: II  Anesthesia Plan: General   Post-op Pain Management:    Induction: Intravenous  Airway Management Planned:   Additional Equipment:   Intra-op Plan:   Post-operative Plan: Extubation in OR  Informed Consent: I have reviewed the patients History and Physical, chart, labs and discussed the procedure including the risks, benefits and alternatives for the proposed anesthesia with the patient or authorized representative who has indicated his/her understanding and acceptance.   Dental advisory given  Plan Discussed with: CRNA  Anesthesia Plan Comments:         Anesthesia Quick Evaluation

## 2014-05-28 NOTE — Op Note (Signed)
Preop diagnosis: Left renal stones Postoperative diagnosis: Left renal stones  Procedure: Cystoscopy Left retrograde pyelogram Left ureteroscopy Holmium laser lithotripsy Stone basket extraction Left ureteral stent placement  Surgeon: Mena Goes  Anesthesia: Gen.  Findings: On cystoscopy the bladder appeared normal without tumor, foreign body or stone.  On left ureteroscopy the stone was found in the UPJ, the outer half was very loose and fragile with the inner core more dense. Fragmentation was excellent. There were several infundibula that were very small and would not accommodate the scope and I wonder if the other stones on the CT within one of these. However it might of been in the upper pole posterior calyx where I pushed the stone for final fragmentation. On final inspection I did not note any significant stone fragments and fluoroscopy confirmed no visible stones. On final ureteroscopy after removing the access sheath the ureter was normal without stone or injury.  On scout imaging an opacity was noted in the region of the left UPJ.  Left retrograde pyelogram-outlined a single ureter single collecting system unit without filling defect, dilation or stricture. In the left renal pelvis there was a large filling defect consistent with the known stone. The rest of the collecting system appeared normal.   Description of procedure: After consent was obtained patient brought to the operating room. After adequate anesthesia he is placed in lithotomy position and prepped and draped in usual sterile fashion. A timeout was performed to confirm the patient and procedure. The cystoscope was passed per urethra and the left ureter cannulated with a cone tip catheter and retrograde injection of contrast was performed. Next a sensor wire was advanced and coiled in the upper pole. Over the sensor wire a dual-lumen exchange catheter was passed without difficulty. A second wire was placed. A dual-lumen was  removed and over one of the wires a ureteral access sheath was advanced into the proximal ureter without any difficulty. The inner cannula and second wire removed. A digital ureteroscope was advanced where the stone was found in the UPJ. I began at a setting of 0.4 and 40 which fragmented the outer core and once toward the inner cord is a more dense stone which was located. After about 50% fragmentation it dropped out of the UPJ and I pushed it into the upper pole calyx for ease of fragmentation. In this calyx it was fragmented down to 1-2 mm pieces. I deployed the [employer] and pulled out any significant pieces. I carefully inspected the entire collecting system and noted no significant stone fragments. The access sheath was backed out on the ureteroscope and the ureter examined and noted to be normal. The wire was backloaded on the cystoscope and a 6 x 26 cm stent was advanced. The wire was removed and a good coil seen in the kidney and a good coil in the bladder. Patient was awakened taken to recovery room in stable condition.  Complications: None Blood loss: Minimal  Specimens: Stone fragments to office lab  Drains: 6 x 26 cm left ureteral stent with tether  Disposition: Patient stable to PACU

## 2014-05-29 ENCOUNTER — Encounter (HOSPITAL_COMMUNITY): Payer: Self-pay | Admitting: Urology

## 2019-05-28 ENCOUNTER — Other Ambulatory Visit: Payer: Self-pay | Admitting: Family Medicine

## 2019-05-28 DIAGNOSIS — Z136 Encounter for screening for cardiovascular disorders: Secondary | ICD-10-CM

## 2019-05-28 DIAGNOSIS — F17200 Nicotine dependence, unspecified, uncomplicated: Secondary | ICD-10-CM

## 2019-06-08 ENCOUNTER — Ambulatory Visit
Admission: RE | Admit: 2019-06-08 | Discharge: 2019-06-08 | Disposition: A | Discharge status: Home / Self Care | Payer: Medicare Other | Source: Ambulatory Visit | Attending: Family Medicine | Admitting: Family Medicine

## 2019-06-08 DIAGNOSIS — Z136 Encounter for screening for cardiovascular disorders: Secondary | ICD-10-CM

## 2019-06-08 DIAGNOSIS — F17200 Nicotine dependence, unspecified, uncomplicated: Secondary | ICD-10-CM

## 2019-11-16 ENCOUNTER — Ambulatory Visit: Payer: Medicare Other | Attending: Internal Medicine

## 2019-11-16 DIAGNOSIS — Z23 Encounter for immunization: Secondary | ICD-10-CM

## 2019-11-16 NOTE — Progress Notes (Signed)
   Covid-19 Vaccination Clinic  Name:  Gary Camacho    MRN: [id number] DOB: January 04, 1951  11/16/2019  [name] was observed post Covid-19 immunization for 15 minutes without incidence. He was provided with Vaccine Information Sheet and instruction to access the V-Safe system.   [name] was instructed to call 911 with any severe reactions post vaccine: Marland Kitchen Difficulty breathing  . Swelling of your face and throat  . A fast heartbeat  . A bad rash all over your body  . Dizziness and weakness    Immunizations Administered    Name Date Dose VIS Date Route   Pfizer COVID-19 Vaccine 11/16/2019  5:18 PM 0.3 mL 09/14/2019 Intramuscular   Manufacturer: [employer], [employer]   Lot: [id number]   NDC: [address]-1

## 2019-12-09 ENCOUNTER — Ambulatory Visit: Payer: Medicare Other | Attending: Internal Medicine

## 2019-12-09 DIAGNOSIS — Z23 Encounter for immunization: Secondary | ICD-10-CM | POA: Insufficient documentation

## 2019-12-09 NOTE — Progress Notes (Signed)
   Covid-19 Vaccination Clinic  Name:  Gary Camacho    MRN: [id number] DOB: 1950/12/04  12/09/2019  Gary Camacho was observed post Covid-19 immunization for 15 minutes without incident. He was provided with Vaccine Information Sheet and instruction to access the V-Safe system.   Gary Camacho was instructed to call 911 with any severe reactions post vaccine: Marland Kitchen Difficulty breathing  . Swelling of face and throat  . A fast heartbeat  . A bad rash all over body  . Dizziness and weakness   Immunizations Administered    Name Date Dose VIS Date Route   Pfizer COVID-19 Vaccine 12/09/2019  2:07 PM 0.3 mL 09/14/2019 Intramuscular   Manufacturer: [employer], [employer]   Lot: [id number]   NDC: [address]-2

## 2020-01-02 ENCOUNTER — Other Ambulatory Visit: Payer: Self-pay | Admitting: Urology

## 2020-02-05 ENCOUNTER — Other Ambulatory Visit: Payer: Self-pay

## 2020-02-05 ENCOUNTER — Encounter (HOSPITAL_BASED_OUTPATIENT_CLINIC_OR_DEPARTMENT_OTHER): Payer: Self-pay | Admitting: Urology

## 2020-02-05 NOTE — Progress Notes (Signed)
Spoke w/ via phone for pre-op interview--- PT Lab needs dos---- Istat (takes diuretic)            Lab results------ no COVID test ------ 02-09-2020 @ 1215 Arrive at ------- 0945 NPO after ------ [place] Medications to take morning of surgery ----- Flomax w/ sips of water Diabetic medication ----- n/a Patient Special Instructions ----- n/a Pre-Op special Istructions ----- n/a Patient verbalized understanding of instructions that were given at this phone interview. Patient denies shortness of breath, chest pain, fever, cough a this phone interview.

## 2020-02-08 ENCOUNTER — Other Ambulatory Visit (HOSPITAL_COMMUNITY): Payer: Medicare Other

## 2020-02-09 ENCOUNTER — Other Ambulatory Visit (HOSPITAL_COMMUNITY)
Admission: RE | Admit: 2020-02-09 | Discharge: 2020-02-09 | Disposition: A | Discharge status: Home / Self Care | Payer: Medicare Other | Source: Ambulatory Visit | Attending: Urology | Admitting: Urology

## 2020-02-09 DIAGNOSIS — Z01812 Encounter for preprocedural laboratory examination: Secondary | ICD-10-CM | POA: Insufficient documentation

## 2020-02-09 DIAGNOSIS — Z20822 Contact with and (suspected) exposure to covid-19: Secondary | ICD-10-CM | POA: Insufficient documentation

## 2020-02-09 LAB — SARS CORONAVIRUS 2 (TAT 6-24 HRS): SARS Coronavirus 2: NEGATIVE

## 2020-02-12 ENCOUNTER — Encounter (HOSPITAL_BASED_OUTPATIENT_CLINIC_OR_DEPARTMENT_OTHER): Payer: Self-pay | Admitting: Urology

## 2020-02-12 ENCOUNTER — Ambulatory Visit (HOSPITAL_BASED_OUTPATIENT_CLINIC_OR_DEPARTMENT_OTHER)
Admission: RE | Admit: 2020-02-12 | Discharge: 2020-02-12 | Disposition: A | Discharge status: Home / Self Care | Payer: Medicare Other | Attending: Urology | Admitting: Urology

## 2020-02-12 ENCOUNTER — Encounter (HOSPITAL_BASED_OUTPATIENT_CLINIC_OR_DEPARTMENT_OTHER)
Admission: RE | Disposition: A | Discharge status: Home / Self Care | Payer: Self-pay | Source: Home / Self Care | Attending: Urology

## 2020-02-12 ENCOUNTER — Ambulatory Visit (HOSPITAL_BASED_OUTPATIENT_CLINIC_OR_DEPARTMENT_OTHER): Payer: Medicare Other | Admitting: Anesthesiology

## 2020-02-12 ENCOUNTER — Other Ambulatory Visit: Payer: Self-pay

## 2020-02-12 DIAGNOSIS — M48061 Spinal stenosis, lumbar region without neurogenic claudication: Secondary | ICD-10-CM | POA: Insufficient documentation

## 2020-02-12 DIAGNOSIS — Z791 Long term (current) use of non-steroidal anti-inflammatories (NSAID): Secondary | ICD-10-CM | POA: Insufficient documentation

## 2020-02-12 DIAGNOSIS — F17210 Nicotine dependence, cigarettes, uncomplicated: Secondary | ICD-10-CM | POA: Diagnosis not present

## 2020-02-12 DIAGNOSIS — F419 Anxiety disorder, unspecified: Secondary | ICD-10-CM | POA: Diagnosis not present

## 2020-02-12 DIAGNOSIS — M19012 Primary osteoarthritis, left shoulder: Secondary | ICD-10-CM | POA: Diagnosis not present

## 2020-02-12 DIAGNOSIS — M5432 Sciatica, left side: Secondary | ICD-10-CM | POA: Diagnosis not present

## 2020-02-12 DIAGNOSIS — R350 Frequency of micturition: Secondary | ICD-10-CM | POA: Insufficient documentation

## 2020-02-12 DIAGNOSIS — Z87442 Personal history of urinary calculi: Secondary | ICD-10-CM | POA: Diagnosis not present

## 2020-02-12 DIAGNOSIS — Z9841 Cataract extraction status, right eye: Secondary | ICD-10-CM | POA: Diagnosis not present

## 2020-02-12 DIAGNOSIS — Z79899 Other long term (current) drug therapy: Secondary | ICD-10-CM | POA: Diagnosis not present

## 2020-02-12 DIAGNOSIS — M19041 Primary osteoarthritis, right hand: Secondary | ICD-10-CM | POA: Insufficient documentation

## 2020-02-12 DIAGNOSIS — Z961 Presence of intraocular lens: Secondary | ICD-10-CM | POA: Diagnosis not present

## 2020-02-12 DIAGNOSIS — R200 Anesthesia of skin: Secondary | ICD-10-CM | POA: Diagnosis not present

## 2020-02-12 DIAGNOSIS — M19042 Primary osteoarthritis, left hand: Secondary | ICD-10-CM | POA: Insufficient documentation

## 2020-02-12 DIAGNOSIS — F17290 Nicotine dependence, other tobacco product, uncomplicated: Secondary | ICD-10-CM | POA: Diagnosis not present

## 2020-02-12 DIAGNOSIS — J439 Emphysema, unspecified: Secondary | ICD-10-CM | POA: Diagnosis not present

## 2020-02-12 DIAGNOSIS — E785 Hyperlipidemia, unspecified: Secondary | ICD-10-CM | POA: Diagnosis not present

## 2020-02-12 DIAGNOSIS — N401 Enlarged prostate with lower urinary tract symptoms: Secondary | ICD-10-CM | POA: Insufficient documentation

## 2020-02-12 DIAGNOSIS — R3915 Urgency of urination: Secondary | ICD-10-CM | POA: Diagnosis not present

## 2020-02-12 DIAGNOSIS — M1990 Unspecified osteoarthritis, unspecified site: Secondary | ICD-10-CM | POA: Diagnosis not present

## 2020-02-12 DIAGNOSIS — J410 Simple chronic bronchitis: Secondary | ICD-10-CM | POA: Insufficient documentation

## 2020-02-12 DIAGNOSIS — Z9842 Cataract extraction status, left eye: Secondary | ICD-10-CM | POA: Insufficient documentation

## 2020-02-12 DIAGNOSIS — R3912 Poor urinary stream: Secondary | ICD-10-CM | POA: Diagnosis not present

## 2020-02-12 DIAGNOSIS — N138 Other obstructive and reflux uropathy: Secondary | ICD-10-CM

## 2020-02-12 HISTORY — DX: Emphysema, unspecified: J43.9

## 2020-02-12 HISTORY — DX: Benign prostatic hyperplasia with lower urinary tract symptoms: N40.1

## 2020-02-12 HISTORY — DX: Hyperlipidemia, unspecified: E78.5

## 2020-02-12 HISTORY — DX: Anesthesia of skin: R20.0

## 2020-02-12 HISTORY — DX: Personal history of urinary calculi: Z87.442

## 2020-02-12 HISTORY — PX: CYSTOSCOPY WITH INSERTION OF UROLIFT: SHX6678

## 2020-02-12 HISTORY — DX: Calculus of kidney: N20.0

## 2020-02-12 HISTORY — DX: Sciatica, left side: M54.32

## 2020-02-12 HISTORY — DX: Simple chronic bronchitis: J41.0

## 2020-02-12 LAB — POCT I-STAT, CHEM 8
BUN: 21 mg/dL (ref 8–23)
Calcium, Ion: 1.25 mmol/L (ref 1.15–1.40)
Chloride: 101 mmol/L (ref 98–111)
Creatinine, Ser: 1.4 mg/dL — ABNORMAL HIGH (ref 0.61–1.24)
Glucose, Bld: 108 mg/dL — ABNORMAL HIGH (ref 70–99)
HCT: 48 % (ref 39.0–52.0)
Hemoglobin: 16.3 g/dL (ref 13.0–17.0)
Potassium: 3.9 mmol/L (ref 3.5–5.1)
Sodium: 141 mmol/L (ref 135–145)
TCO2: 32 mmol/L (ref 22–32)

## 2020-02-12 SURGERY — CYSTOSCOPY WITH INSERTION OF UROLIFT
Anesthesia: General | Site: Bladder

## 2020-02-12 MED ORDER — CEFAZOLIN SODIUM-DEXTROSE 2-4 GM/100ML-% IV SOLN
INTRAVENOUS | Status: AC
  Filled 2020-02-12: qty 100

## 2020-02-12 MED ORDER — ONDANSETRON HCL 4 MG/2ML IJ SOLN
INTRAMUSCULAR | Status: DC | PRN
  Administered 2020-02-12: 4 mg via INTRAVENOUS

## 2020-02-12 MED ORDER — MIDAZOLAM HCL 5 MG/5ML IJ SOLN
INTRAMUSCULAR | Status: DC | PRN
  Administered 2020-02-12: 2 mg via INTRAVENOUS

## 2020-02-12 MED ORDER — PROPOFOL 10 MG/ML IV BOLUS
INTRAVENOUS | Status: AC
  Filled 2020-02-12: qty 20

## 2020-02-12 MED ORDER — ONDANSETRON HCL 4 MG/2ML IJ SOLN: 4.0000 mg | Freq: Once | INTRAMUSCULAR | Status: DC | PRN

## 2020-02-12 MED ORDER — PROPOFOL 10 MG/ML IV BOLUS
INTRAVENOUS | Status: DC | PRN
  Administered 2020-02-12: 160 mg via INTRAVENOUS

## 2020-02-12 MED ORDER — DEXAMETHASONE SODIUM PHOSPHATE 4 MG/ML IJ SOLN
INTRAMUSCULAR | Status: DC | PRN
  Administered 2020-02-12: 5 mg via INTRAVENOUS

## 2020-02-12 MED ORDER — LACTATED RINGERS IV SOLN: INTRAVENOUS | Status: DC

## 2020-02-12 MED ORDER — OXYCODONE HCL 5 MG PO TABS
ORAL_TABLET | ORAL | Status: AC
  Filled 2020-02-12: qty 1

## 2020-02-12 MED ORDER — FENTANYL CITRATE (PF) 100 MCG/2ML IJ SOLN
INTRAMUSCULAR | Status: AC
  Filled 2020-02-12: qty 2

## 2020-02-12 MED ORDER — FENTANYL CITRATE (PF) 100 MCG/2ML IJ SOLN
25.0000 ug | INTRAMUSCULAR | Status: DC | PRN
  Administered 2020-02-12 (×2): 50 ug via INTRAVENOUS

## 2020-02-12 MED ORDER — OXYCODONE HCL 5 MG PO TABS
5.0000 mg | ORAL_TABLET | Freq: Once | ORAL | Status: AC | PRN
  Administered 2020-02-12: 15:00:00 5 mg via ORAL

## 2020-02-12 MED ORDER — SODIUM CHLORIDE 0.9 % IR SOLN
Status: DC | PRN
  Administered 2020-02-12: 6000 mL via INTRAVESICAL

## 2020-02-12 MED ORDER — TAMSULOSIN HCL 0.4 MG PO CAPS: 0.4000 mg | ORAL_CAPSULE | Freq: Every day | ORAL | Status: AC

## 2020-02-12 MED ORDER — LIDOCAINE 2% (20 MG/ML) 5 ML SYRINGE
INTRAMUSCULAR | Status: AC
  Filled 2020-02-12: qty 5

## 2020-02-12 MED ORDER — FENTANYL CITRATE (PF) 100 MCG/2ML IJ SOLN
INTRAMUSCULAR | Status: DC | PRN
  Administered 2020-02-12: 25 ug via INTRAVENOUS
  Administered 2020-02-12: 50 ug via INTRAVENOUS
  Administered 2020-02-12: 25 ug via INTRAVENOUS

## 2020-02-12 MED ORDER — OXYCODONE HCL 5 MG/5ML PO SOLN: 5.0000 mg | Freq: Once | ORAL | Status: AC | PRN

## 2020-02-12 MED ORDER — LIDOCAINE HCL (CARDIAC) PF 100 MG/5ML IV SOSY
PREFILLED_SYRINGE | INTRAVENOUS | Status: DC | PRN
  Administered 2020-02-12: 60 mg via INTRAVENOUS

## 2020-02-12 MED ORDER — CEFAZOLIN SODIUM-DEXTROSE 2-4 GM/100ML-% IV SOLN
2.0000 g | Freq: Once | INTRAVENOUS | Status: AC
  Administered 2020-02-12: 11:00:00 2 g via INTRAVENOUS

## 2020-02-12 MED ORDER — MIDAZOLAM HCL 2 MG/2ML IJ SOLN
INTRAMUSCULAR | Status: AC
  Filled 2020-02-12: qty 2

## 2020-02-12 MED ORDER — DEXAMETHASONE SODIUM PHOSPHATE 10 MG/ML IJ SOLN
INTRAMUSCULAR | Status: AC
  Filled 2020-02-12: qty 1

## 2020-02-12 MED ORDER — ONDANSETRON HCL 4 MG/2ML IJ SOLN
INTRAMUSCULAR | Status: AC
  Filled 2020-02-12: qty 2

## 2020-02-12 SURGICAL SUPPLY — 27 items
BAG DRAIN URO-CYSTO SKYTR STRL (DRAIN) ×3 IMPLANT
BAG URINE DRAIN 2000ML AR STRL (UROLOGICAL SUPPLIES) ×3 IMPLANT
BAG URINE LEG 500ML (DRAIN) IMPLANT
CATH COUDE FOLEY 2W 5CC 18FR (CATHETERS) ×3 IMPLANT
CATH FOLEY 2WAY SLVR  5CC 16FR (CATHETERS)
CATH FOLEY 2WAY SLVR  5CC 18FR (CATHETERS)
CATH FOLEY 2WAY SLVR 5CC 16FR (CATHETERS) IMPLANT
CATH FOLEY 2WAY SLVR 5CC 18FR (CATHETERS) IMPLANT
CLOTH BEACON ORANGE TIMEOUT ST (SAFETY) ×3 IMPLANT
ELECT REM PT RETURN 9FT ADLT (ELECTROSURGICAL)
ELECTRODE REM PT RTRN 9FT ADLT (ELECTROSURGICAL) IMPLANT
GLOVE BIO SURGEON STRL SZ7.5 (GLOVE) ×3 IMPLANT
GLOVE BIO SURGEON STRL SZ8 (GLOVE) IMPLANT
GOWN STRL REUS W/ TWL XL LVL3 (GOWN DISPOSABLE) ×1 IMPLANT
GOWN STRL REUS W/TWL XL LVL3 (GOWN DISPOSABLE) ×3
HOLDER FOLEY CATH W/STRAP (MISCELLANEOUS) ×3 IMPLANT
IV NS IRRIG 3000ML ARTHROMATIC (IV SOLUTION) ×6 IMPLANT
KIT TURNOVER CYSTO (KITS) ×3 IMPLANT
MANIFOLD NEPTUNE II (INSTRUMENTS) ×3 IMPLANT
NEEDLE HYPO 22GX1.5 SAFETY (NEEDLE) IMPLANT
NS IRRIG 500ML POUR BTL (IV SOLUTION) IMPLANT
SYSTEM UROLIFT (Male Continence) ×15 IMPLANT
TRAY CYSTO PACK (CUSTOM PROCEDURE TRAY) ×3 IMPLANT
TUBE CONNECTING 12'X1/4 (SUCTIONS) ×1
TUBE CONNECTING 12X1/4 (SUCTIONS) ×2 IMPLANT
WATER STERILE IRR 3000ML UROMA (IV SOLUTION) IMPLANT
WATER STERILE IRR 500ML POUR (IV SOLUTION) ×3 IMPLANT

## 2020-02-12 NOTE — Op Note (Signed)
Preoperative diagnosis: BPH with obstructive symptomatology. Postoperative diagnosis: Same  Principal procedure: Urolift procedure, with the placement of 5 implants.  Surgeon: Mena Goes  Anesthesia: Gen  Complications: None  Drains: 18 French Foley catheter, to leg bag.  Estimated blood loss: Less than 25 mL  Specimens: none   Indications: 69 year old male with obstructive symptomatology secondary to BPH. The patient's symptoms have progressed, and he has requested further management. Management options including TURP with resection/ablation of the prostate as well as Urolift were discussed. The patient has chosen to have a Urolift procedure. He has been instructed to the procedure as well as risks and complications which include but are not limited to infection, bleeding, and inadequate treatment with the Urolift procedure alone, anesthetic complications, among others. He understands these and desires to proceed.  Findings: Using the 17 French cystoscope, urethra and bladder were inspected. There were no urethral lesions. Prostatic urethra was obstructed secondary to bilobar hypertrophy with a median lobe protruding into bladder but large right sulcus at bladder neck and median lobe not obstructive once the lateral lobes were pinned away. The bladder was inspected circumferentially. This revealed normal findings. Moderate trabeculation.   Description of procedure: The patient was properly identified in the holding area. He received preoperative antibiotics. He was taken to the procedure room and after adequate anesthesia he was placed in the dorsolithotomy position. Genitalia and perineum were prepped and draped. Proper timeout was performed. A 22 Fr cystoscope was inserted into the bladder. I switched out to the 17Fr cystoscope. The cystoscopy bridge was replaced with a UroLift delivery device.The first treatment site was the patient's left side approximately 1.5cm distal to the bladder neck.  The distal tip of the delivery device was then angled laterally approximately 20 degrees at this position to compress the lateral lobe. The trigger was pulled, thereby deploying a needle containing the implant through the prostate. The needle was then retracted, allowing one end of the implant to be delivered to the capsular surface of the prostate. The implant was then tensioned to assure capsular seating and removal of slack monofilament. The device was then angled back toward midline and slowly advanced proximally until cystoscopic verification of the monofilament being centered in the delivery bay. The urethral end piece was then affixed to the monofilament thereby tailoring the size of the implant. Excess filament was then severed. The delivery device was then re-advanced into the bladder. The delivery device was then replaced with cystoscope and bridge and the implant location and opening effect was confirmed cystoscopically. The same procedure was then repeated on the right side, and 3 additional implants were delivered just proximal to the verumontanum, with two on the left (#3 which stacked below the first one and then #4 which opened the distal channel) and one on the right (#5) side of the prostate, following the same technique. A final cystoscopy was conducted first to inspect the location and state of each implant and second, to confirm the presence of a continuous anterior channel was present through the prostatic urethra with irrigation flow turned off. 5 Implants were delivered in total. The median lobe did not need to be treated. Following this, the scope was removed and an 37 French Foley catheter was placed and hooked to dependent drainage. He was then awakened and taken to the recovery area in stable condition. He tolerated the procedure well.

## 2020-02-12 NOTE — Discharge Instructions (Signed)
Prostatic Urethral Lift, Care After This sheet gives you information about how to care for yourself after your procedure. Your health care provider may also give you more specific instructions. If you have problems or questions, contact your health care provider. What can I expect after the procedure? After the procedure, it is common to have:  Discomfort or burning when urinating.  An increased urge to urinate.  More frequent urination.  Urine that is slightly blood-tinged. These symptoms should go away after a few days. Follow these instructions at home:   Take over-the-counter and prescription medicines only as told by your health care provider.  Do not drive for 24 hours if you were given a medicine to help you relax (sedative).  Do not drive or use heavy machinery while taking prescription pain medicine.  Do not lift anything that is heavier than 10 lb (4.5 kg) until your health care provider says that this is safe.  Return to your normal activities as told by your health care provider. Ask your health care provider what activities are safe for you. Ask when you can return to sexual activity.  Drink enough fluid to keep your urine clear or pale yellow.  Keep all follow-up visits as told by your health care provider. This is important. Contact a health care provider if:  You have chills or a fever.  You have pain when passing urine.  You have bright red blood or blood clots in your urine.  You have difficulty passing urine.  You have leaking of urine (incontinence). Get help right away if:  You have chest pain or shortness of breath.  You have leg pain or swelling.  You cannot pass urine. Summary  After the procedure, it is common to have discomfort or burning when urinating, an increased urge to urinate, more frequent urination, and urine that is slightly blood-tinged.  Do not drive for 24 hours if you were given a medicine to help you relax (sedative). Do not  drive or use heavy machinery while taking prescription pain medicine.  Do not lift anything that is heavier than 10 lb (4.5 kg) until your health care provider says that this is safe.  Return to your normal activities as told by your health care provider. This information is not intended to replace advice given to you by your health care provider. Make sure you discuss any questions you have with your health care provider. Document Revised: 09/02/2017 Document Reviewed: 11/09/2016 Elsevier Patient Education  2020 [employer]   Post Anesthesia Home Care Instructions  Activity: Get plenty of rest for the remainder of the day. A responsible individual must stay with you for 24 hours following the procedure.  For the next 24 hours, DO NOT: -Drive a car -Operate machinery -Drink alcoholic beverages -Take any medication unless instructed by your physician -Make any legal decisions or sign important papers.  Meals: Start with liquid foods such as gelatin or soup. Progress to regular foods as tolerated. Avoid greasy, spicy, heavy foods. If nausea and/or vomiting occur, drink only clear liquids until the nausea and/or vomiting subsides. Call your physician if vomiting continues.  Special Instructions/Symptoms: Your throat may feel dry or sore from the anesthesia or the breathing tube placed in your throat during surgery. If this causes discomfort, gargle with warm salt water. The discomfort should disappear within 24 hours.       

## 2020-02-12 NOTE — Anesthesia Postprocedure Evaluation (Signed)
Anesthesia Post Note  Patient: Gary Camacho  Procedure(s) Performed: CYSTOSCOPY WITH INSERTION OF UROLIFT (N/A Bladder)     Patient location during evaluation: PACU Anesthesia Type: General Level of consciousness: awake and alert Pain management: pain level controlled Vital Signs Assessment: post-procedure vital signs reviewed and stable Respiratory status: spontaneous breathing, nonlabored ventilation and respiratory function stable Cardiovascular status: blood pressure returned to baseline and stable Postop Assessment: no apparent nausea or vomiting Anesthetic complications: no    Last Vitals:  Vitals:   02/12/20 1215 02/12/20 1230  BP: 129/87 133/88  Pulse: (!) 57 (!) 57  Resp: 14 14  Temp:    SpO2: 98% 99%    Last Pain:  Vitals:   02/12/20 1215  TempSrc:   PainSc: 0-No pain                 Beryle Lathe

## 2020-02-12 NOTE — Transfer of Care (Signed)
Immediate Anesthesia Transfer of Care Note  Patient: Gary Camacho  Procedure(s) Performed: Procedure(s) (LRB): CYSTOSCOPY WITH INSERTION OF UROLIFT (N/A)  Patient Location: PACU  Anesthesia Type: General  Level of Consciousness: awake, sedated, patient cooperative and responds to stimulation  Airway & Oxygen Therapy: Patient Spontanous Breathing and Patient connected to [place] O2 and soft FM   Post-op Assessment: Report given to PACU RN, Post -op Vital signs reviewed and stable and Patient moving all extremities  Post vital signs: Reviewed and stable  Complications: No apparent anesthesia complications

## 2020-02-12 NOTE — Anesthesia Preprocedure Evaluation (Addendum)
Anesthesia Evaluation  Patient identified by MRN, date of birth, ID band Patient awake    Reviewed: Allergy & Precautions, NPO status , Patient's Chart, lab work & pertinent test results  History of Anesthesia Complications Negative for: history of anesthetic complications  Airway Mallampati: I  TM Distance: >3 FB Neck ROM: Full    Dental  (+) Dental Advisory Given   Pulmonary COPD, Current SmokerPatient did not abstain from smoking.,    Pulmonary exam normal        Cardiovascular negative cardio ROS Normal cardiovascular exam     Neuro/Psych PSYCHIATRIC DISORDERS Anxiety  Neuromuscular disease (left leg numbness/sciatica)    GI/Hepatic negative GI ROS, Neg liver ROS,   Endo/Other  negative endocrine ROS  Renal/GU negative Renal ROS     Musculoskeletal  (+) Arthritis ,  Lumbar stenosis    Abdominal   Peds  Hematology negative hematology ROS (+)   Anesthesia Other Findings Covid neg 5/8  Reproductive/Obstetrics                            Anesthesia Physical Anesthesia Plan  ASA: II  Anesthesia Plan: General   Post-op Pain Management:    Induction: Intravenous  PONV Risk Score and Plan: 2 and Treatment may vary due to age or medical condition, Ondansetron and Dexamethasone  Airway Management Planned: LMA  Additional Equipment: None  Intra-op Plan:   Post-operative Plan: Extubation in OR  Informed Consent: I have reviewed the patients History and Physical, chart, labs and discussed the procedure including the risks, benefits and alternatives for the proposed anesthesia with the patient or authorized representative who has indicated his/her understanding and acceptance.     Dental advisory given  Plan Discussed with: CRNA and Anesthesiologist  Anesthesia Plan Comments:        Anesthesia Quick Evaluation

## 2020-02-12 NOTE — Progress Notes (Signed)
Foley cath clotted on arrival. Irrigated easily with 50 ml NS and all fluid returned. Draining red urine without clots. Traction applied to cath with leg strap. Dr. Mena Goes aware.

## 2020-02-12 NOTE — H&P (Addendum)
H&P  Chief Complaint: I have symptoms of an enlarged prostate.   HPI: Gary Camacho is a 69 year-old male established patient who is here for symptoms of enlarged prostate.  He first noticed the symptoms approximately 09/03/2009. His symptoms have not gotten worse over the last year. He has been treated with Flomax. The patient has never had a surgical procedure for bladder outlet obstruction to his prostate.   PSA 2.57 - Sep 15, 2016. PSA was 3.44 in 2018.   PSA was 2.8 and now 2.75. He c/o weak stream, frequency that aren't "unlivable" but sometimes a "pain". He takes tamsulosin BID. PSA was 08/20 3.28. We discussed cystoscopy. Voiding improved when he takes tamsulosin. Prostate 95 grams on [place] (02/21) with a median lobe. LUTS improved several hrs after each dose. Intermittent urgency. Cysto 03/21, with median and lateral lobe hypertrophy. PVR 209 ml. AUASS = 24.    He is well today with no hematuria or dysuria. No fever or cough.   Past Medical History:  Diagnosis Date  . Anxiety   . Arthritis    fingers, hands, left shoulder  . Benign localized prostatic hyperplasia with lower urinary tract symptoms (LUTS)   . Emphysema lung ([place])    followed by pcp----  no inhaler's or oxygen  . History of kidney stones   . Hyperlipidemia   . Left leg numbness    02-05-2020  per pt due to lumbar stenosis   . Lumbar stenosis    02-05-2020  per pt L4-5  . Nephrolithiasis    02-05-2020  per pt has one nonobstructive renal stone , unsure which side  . Sciatica, left side   . Smokers' cough ([place])    02-05-2020  per pt in morning's , productive occasionally   Past Surgical History:  Procedure Laterality Date  . CATARACT EXTRACTION W/ INTRAOCULAR LENS  IMPLANT, BILATERAL  2014 approx.  . CYSTOSCOPY WITH URETEROSCOPY  06/13/2012   Procedure: CYSTOSCOPY WITH URETEROSCOPY;  Surgeon: Fredricka Bonine, MD;  Location: [facility];  Service: Urology;  Laterality: Left;  Left retrograde pyelogram, Basket  extraction left ureteral stone, Insertion Left double J stent  . CYSTOSCOPY WITH URETEROSCOPY AND STENT PLACEMENT Left 05/28/2014   Procedure: LEFT URETEROSCOPY AND STENT PLACEMENT;  Surgeon: Festus Aloe, MD;  Location: [facility];  Service: Urology;  Laterality: Left;  . EXTRACORPOREAL SHOCK WAVE LITHOTRIPSY  06-12-2012  @[facility]   . HOLMIUM LASER APPLICATION Left 2/83/6629   Procedure: HOLMIUM LASER APPLICATION;  Surgeon: Festus Aloe, MD;  Location: [facility];  Service: Urology;  Laterality: Left;  . KNEE ARTHROSCOPY Left 2012    Home Medications:  Medications Prior to Admission  Medication Sig Dispense Refill Last Dose  . ibuprofen (ADVIL,MOTRIN) 200 MG tablet Take 800 mg by mouth every 6 (six) hours as needed (Pain).     . indapamide (LOZOL) 1.25 MG tablet Take 1.25 mg by mouth daily.      . methylphenidate (CONCERTA) 54 MG CR tablet Take 54 mg by mouth every morning.      . methylphenidate (RITALIN) 10 MG tablet Take 10 mg by mouth daily. 2pm      . simvastatin (ZOCOR) 10 MG tablet Take 10 mg by mouth at bedtime.     . Tamsulosin HCl (FLOMAX) 0.4 MG CAPS Take 0.4 mg by mouth 2 (two) times daily.       Allergies: No Known Allergies  History reviewed. No pertinent family history. Social History:  reports that he has been smoking pipe and  cigarettes. He has smoked for the past 49.00 years. He has never used smokeless tobacco. He reports current alcohol use of about 7.0 standard drinks of alcohol per week. He reports previous drug use.  ROS: A complete review of systems was performed.  All systems are negative except for pertinent findings as noted. Review of Systems  All other systems reviewed and are negative.    Physical Exam:  Vital signs in last 24 hours:   General:  Alert and oriented, No acute distress HEENT: Normocephalic, atraumatic Cardiovascular: Regular rate and rhythm Lungs: Regular rate and effort Abdomen: Soft, nontender, nondistended, no abdominal masses Back: No  CVA tenderness Extremities: No edema Neurologic: Grossly intact  Laboratory Data:  Results for orders placed or performed during the hospital encounter of 02/12/20 (from the past 24 hour(s))  I-STAT, chem 8     Status: Abnormal   Collection Time: 02/12/20 10:22 AM  Result Value Ref Range   Sodium 141 135 - 145 mmol/L   Potassium 3.9 3.5 - 5.1 mmol/L   Chloride 101 98 - 111 mmol/L   BUN 21 8 - 23 mg/dL   Creatinine, Ser 5.39 (H) 0.61 - 1.24 mg/dL   Glucose, Bld 767 (H) 70 - 99 mg/dL   Calcium, Ion 3.41 9.37 - 1.40 mmol/L   TCO2 32 22 - 32 mmol/L   Hemoglobin 16.3 13.0 - 17.0 g/dL   HCT 90.2 40.9 - 73.5 %   Recent Results (from the past 240 hour(s))  SARS CORONAVIRUS 2 (TAT 6-24 HRS) Nasopharyngeal Nasopharyngeal Swab     Status: None   Collection Time: 02/09/20 12:31 PM   Specimen: Nasopharyngeal Swab  Result Value Ref Range Status   SARS Coronavirus 2 NEGATIVE NEGATIVE Final    Comment: (NOTE) SARS-CoV-2 target nucleic acids are NOT DETECTED. The SARS-CoV-2 RNA is generally detectable in upper and lower respiratory specimens during the acute phase of infection. Negative results do not preclude SARS-CoV-2 infection, do not rule out co-infections with other pathogens, and should not be used as the sole basis for treatment or other patient management decisions. Negative results must be combined with clinical observations, patient history, and epidemiological information. The expected result is Negative. Fact Sheet for Patients: HairSlick.no Fact Sheet for Healthcare Providers: quierodirigir.com This test is not yet approved or cleared by the [place] FDA and  has been authorized for detection and/or diagnosis of SARS-CoV-2 by FDA under an Emergency Use Authorization (EUA). This EUA will remain  in effect (meaning this test can be used) for the duration of the COVID-19 declaration under Section 56 4(b)(1) of the Act, 21  U.S.C. section 360bbb-3(b)(1), unless the authorization is terminated or revoked sooner. Performed at [facility], [address] [place], [place] [address]    Creatinine: Recent Labs    02/12/20 1022  CREATININE 1.40*    Impression/Assessment/plan:  I discussed with the patient the nature, potential benefits, risks and alternatives to Urolift PUL, including side effects of the proposed treatment, the likelihood of the patient achieving the goals of the procedure, and any potential problems that might occur during the procedure or recuperation. All questions answered. Patient elects to proceed. Discussed possibility of post-op catheter.    Jerilee Field 02/12/2020, 10:26 AM

## 2020-02-12 NOTE — Anesthesia Procedure Notes (Signed)
Procedure Name: LMA Insertion Date/Time: 02/12/2020 11:15 AM Performed by: Jessica Priest, CRNA Pre-anesthesia Checklist: Patient identified, Emergency Drugs available, Suction available, Patient being monitored and Timeout performed Patient Re-evaluated:Patient Re-evaluated prior to induction Oxygen Delivery Method: Circle system utilized Preoxygenation: Pre-oxygenation with 100% oxygen Induction Type: IV induction Ventilation: Mask ventilation without difficulty LMA: LMA inserted LMA Size: 5.0 Number of attempts: 1 Airway Equipment and Method: Bite block Placement Confirmation: positive ETCO2,  breath sounds checked- equal and bilateral and CO2 detector Tube secured with: Tape Dental Injury: Teeth and Oropharynx as per pre-operative assessment

## 2020-06-16 ENCOUNTER — Ambulatory Visit: Payer: Self-pay

## 2020-06-16 ENCOUNTER — Ambulatory Visit (INDEPENDENT_AMBULATORY_CARE_PROVIDER_SITE_OTHER): Payer: Medicare Other | Admitting: Orthopedic Surgery

## 2020-06-16 DIAGNOSIS — M79605 Pain in left leg: Secondary | ICD-10-CM

## 2020-06-18 ENCOUNTER — Encounter: Payer: Self-pay | Admitting: Orthopedic Surgery

## 2020-06-18 NOTE — Progress Notes (Signed)
Office Visit Note   Patient: Gary Camacho           Date of Birth: 09-29-1951           MRN: [id number] Visit Date: 06/16/2020 Requested by: Gary Gosselin, MD [address] [place],  [place] [address] PCP: Gary Gosselin, MD  Subjective: Chief Complaint  Patient presents with  . Left Knee - Pain  . Left Leg - Pain    HPI: Gary Camacho is a 69 year old patient with left knee pain.  Reports some radiating pain in the leg with low back pain as well as numbness and tingling in the leg.  Describes some occasional weakness and giving way.  Denies any locking and popping.  Had prior ACL reconstruction done 8 years ago.  Also had prior ESI 7 years ago with Dr. Alvester Camacho but did not continue due to his belief that his knee and back issues may be associated.  He cannot take ibuprofen due to kidney issues.  He was seen by Dr. Franky Camacho 3 years ago and told that he needed surgery for L4-5 stenosis.  He wants to avoid surgery if possible.              ROS: All systems reviewed are negative as they relate to the chief complaint within the history of present illness.  Patient denies  fevers or chills.   Assessment & Plan: Visit Diagnoses:  1. Pain in left leg     Plan: Impression is left knee pain which I think is coming from his back.  Not much in the way of arthritis in the knee with no effusion and stable graft.  I think he is having signs and symptoms very consistent with spinal stenosis.  Like to send him to Dr. Alvester Camacho for lumbar spine ESI.  Follow-up with me as needed.  No indication for any intervention on the knee at this time.  Follow-Up Instructions: No follow-ups on file.   Orders:  Orders Placed This Encounter  Procedures  . XR KNEE 3 VIEW LEFT  . XR Lumbar Spine 2-3 Views  . Ambulatory referral to Physical Medicine Rehab   No orders of the defined types were placed in this encounter.     Procedures: No procedures performed   Clinical Data: No additional  findings.  Objective: Vital Signs: There were no vitals taken for this visit.  Physical Exam:   Constitutional: Patient appears well-developed HEENT:  Head: Normocephalic Eyes:EOM are normal Neck: Normal range of motion Cardiovascular: Normal rate Pulmonary/chest: Effort normal Neurologic: Patient is alert Skin: Skin is warm Psychiatric: Patient has normal mood and affect    Ortho Exam: Ortho exam demonstrates full range of motion both knees with no atrophy in the muscles.  Ankle dorsiflexion plantarflexion quad hamstring strength is intact with palpable pedal pulses.  No groin pain with internal extra rotation of either leg.  Mild pain with forward lateral bending.  Reflexes symmetric.  Negative clonus bilaterally.  Left knee has no effusion stable graft excellent range of motion and no joint line tenderness.    Specialty Comments:  No specialty comments available.  Imaging: No results found.   PMFS History: There are no problems to display for this patient.  Past Medical History:  Diagnosis Date  . Anxiety   . Arthritis    fingers, hands, left shoulder  . Benign localized prostatic hyperplasia with lower urinary tract symptoms (LUTS)   . Emphysema lung ([facility])    followed by pcp----  no inhaler's  or oxygen  . History of kidney stones   . Hyperlipidemia   . Left leg numbness    02-05-2020  per pt due to lumbar stenosis   . Lumbar stenosis    02-05-2020  per pt L4-5  . Nephrolithiasis    02-05-2020  per pt has one nonobstructive renal stone , unsure which side  . Sciatica, left side   . Smokers' cough ([facility])    02-05-2020  per pt in morning's , productive occasionally    History reviewed. No pertinent family history.  Past Surgical History:  Procedure Laterality Date  . CATARACT EXTRACTION W/ INTRAOCULAR LENS  IMPLANT, BILATERAL  2014 approx.  . CYSTOSCOPY WITH INSERTION OF UROLIFT N/A 02/12/2020   Procedure: CYSTOSCOPY WITH INSERTION OF UROLIFT;  Surgeon:  Jerilee Field, MD;  [facility];  Service: Urology;  Laterality: N/A;  . CYSTOSCOPY WITH URETEROSCOPY  06/13/2012   Procedure: CYSTOSCOPY WITH URETEROSCOPY;  Surgeon: Antony Haste, MD;  Location: [facility];  Service: Urology;  Laterality: Left;  Left retrograde pyelogram, Basket extraction left ureteral stone, Insertion Left double J stent  . CYSTOSCOPY WITH URETEROSCOPY AND STENT PLACEMENT Left 05/28/2014   Procedure: LEFT URETEROSCOPY AND STENT PLACEMENT;  Surgeon: Jerilee Field, MD;  Location: [facility];  Service: Urology;  Laterality: Left;  . EXTRACORPOREAL SHOCK WAVE LITHOTRIPSY  06-12-2012  @[facility]   . HOLMIUM LASER APPLICATION Left 05/28/2014   Procedure: HOLMIUM LASER APPLICATION;  Surgeon: 05/30/2014, MD;  Location: [facility];  Service: Urology;  Laterality: Left;  . KNEE ARTHROSCOPY Left 2012   Social History   Occupational History  . Not on file  Tobacco Use  . Smoking status: Current Every Day Smoker    Years: 49.00    Types: Pipe, Cigarettes  . Smokeless tobacco: Never Used  . Tobacco comment: 02-05-2020  per pt quit cigarettes 1985 (smoked cig's 9yrs) and continued smoking pipe (stated uses 0.75oz per day)  Vaping Use  . Vaping Use: Never used  Substance and Sexual Activity  . Alcohol use: Yes    Alcohol/week: 7.0 standard drinks    Types: 7 Cans of beer per week    Comment: 1 beer per day (12 oz)  . Drug use: Not Currently    Comment: 02-05-2020 hx polysubstance use until 1985  . Sexual activity: Not on file

## 2020-06-18 NOTE — Progress Notes (Signed)
done

## 2020-07-09 ENCOUNTER — Encounter: Payer: Self-pay | Admitting: Physical Medicine and Rehabilitation

## 2020-07-09 ENCOUNTER — Ambulatory Visit (INDEPENDENT_AMBULATORY_CARE_PROVIDER_SITE_OTHER): Payer: Medicare Other | Admitting: Physical Medicine and Rehabilitation

## 2020-07-09 ENCOUNTER — Ambulatory Visit: Payer: Self-pay

## 2020-07-09 ENCOUNTER — Other Ambulatory Visit: Payer: Self-pay

## 2020-07-09 VITALS — BP 142/96 | HR 87

## 2020-07-09 DIAGNOSIS — M5416 Radiculopathy, lumbar region: Secondary | ICD-10-CM

## 2020-07-09 DIAGNOSIS — M48061 Spinal stenosis, lumbar region without neurogenic claudication: Secondary | ICD-10-CM

## 2020-07-09 MED ORDER — METHYLPREDNISOLONE ACETATE 80 MG/ML IJ SUSP
80.0000 mg | Freq: Once | INTRAMUSCULAR | Status: AC
  Administered 2020-07-09: 80 mg

## 2020-07-09 NOTE — Progress Notes (Signed)
Pt state lower back pain that travels down to his left leg. Pt state standing and driving makes the pain worse. Pt state he has to sit down and elevate his left leg for relief.  Numeric Pain Rating Scale and Functional Assessment Average Pain 4   In the last MONTH (on 0-10 scale) has pain interfered with the following?  1. General activity like being  able to carry out your everyday physical activities such as walking, climbing stairs, carrying groceries, or moving a chair?  Rating(4)   +Driver, -BT, -Dye Allergies.

## 2020-07-16 IMAGING — US US ABDOMINAL AORTA SCREENING AAA
1 series · 14 of 14 positions shown · non-contrast
Comparison: None.

CLINICAL DATA: Male between 65-75 years of age with a smoking
history.

EXAM:
US ABDOMINAL AORTA MEDICARE SCREENING
TECHNIQUE: Ultrasound examination of the abdominal aorta was performed as a
screening evaluation for abdominal aortic aneurysm.

[Series 1: us abdominal aorta screening aaa · 0.23mm/px · 14 of 14 slices shown]
[im 1/14]
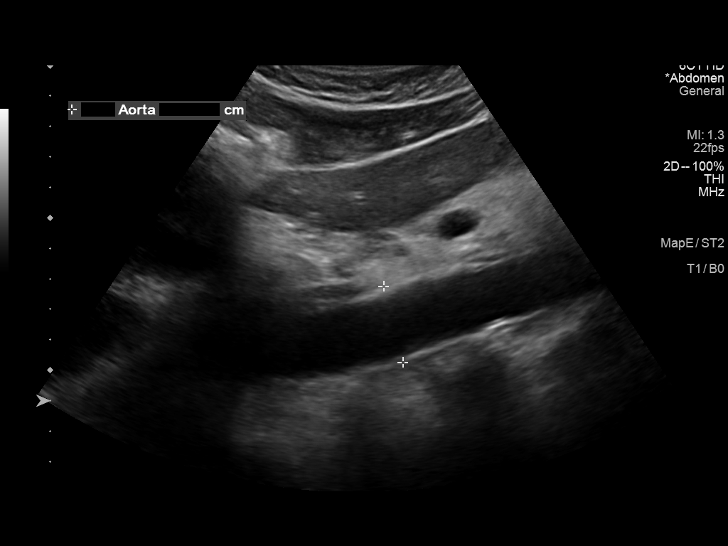
[im 2/14]
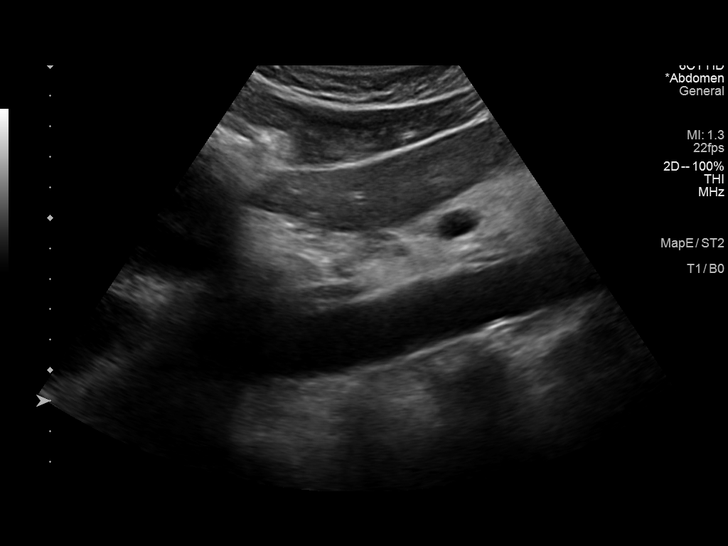
[im 3/14]
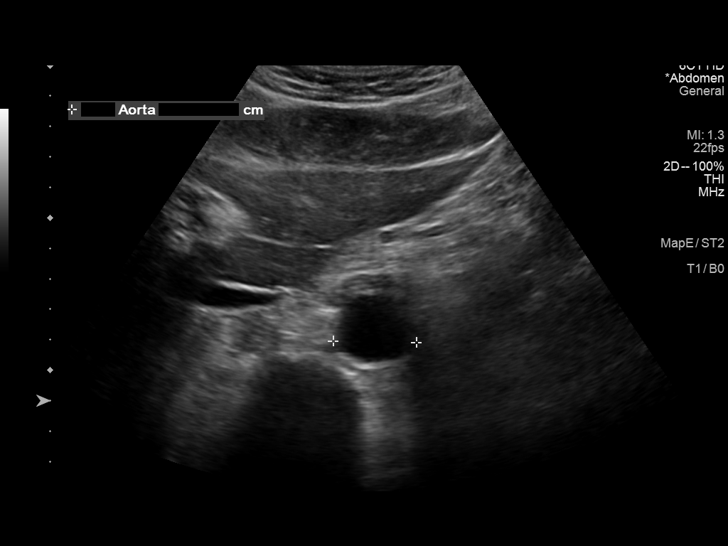
[im 4/14]
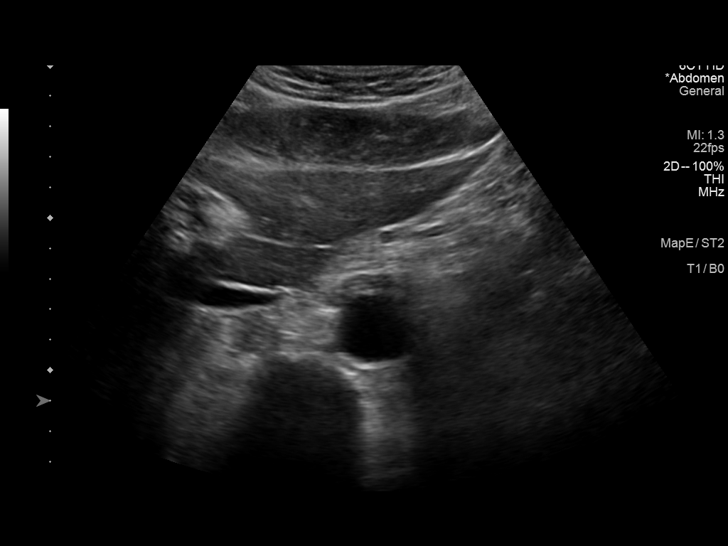
[im 5/14]
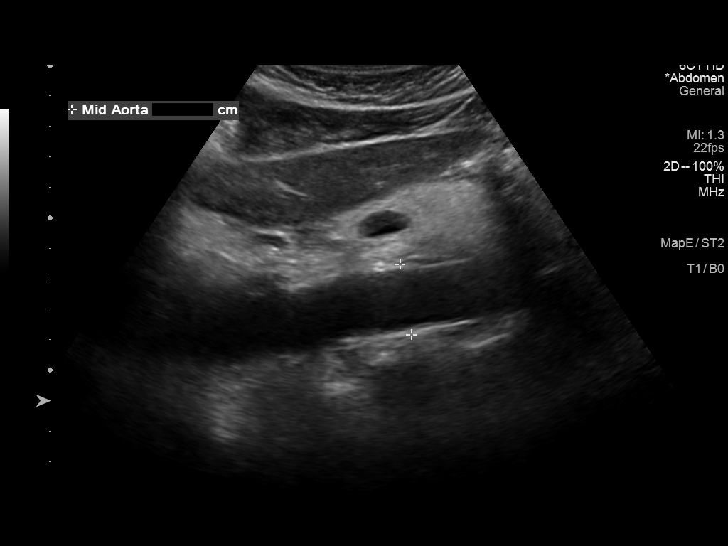
[im 6/14]
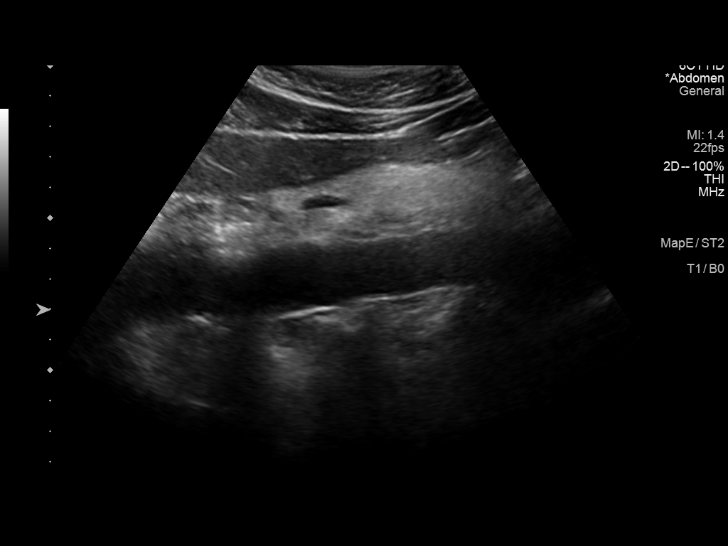
[im 7/14]
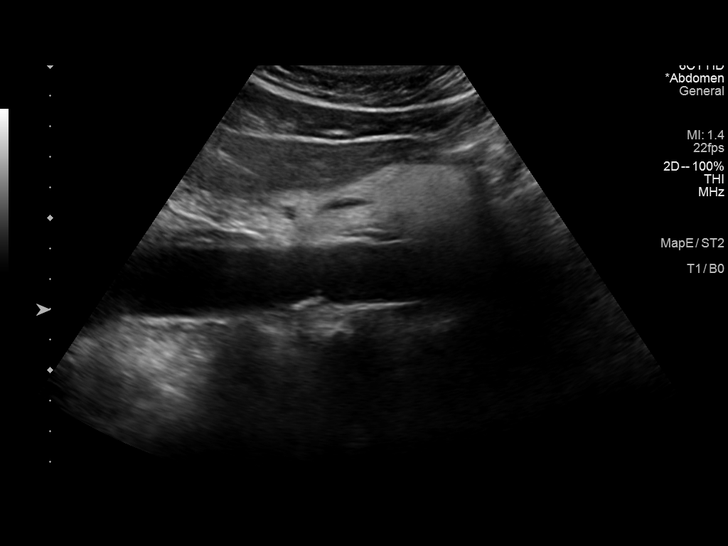
[im 8/14]
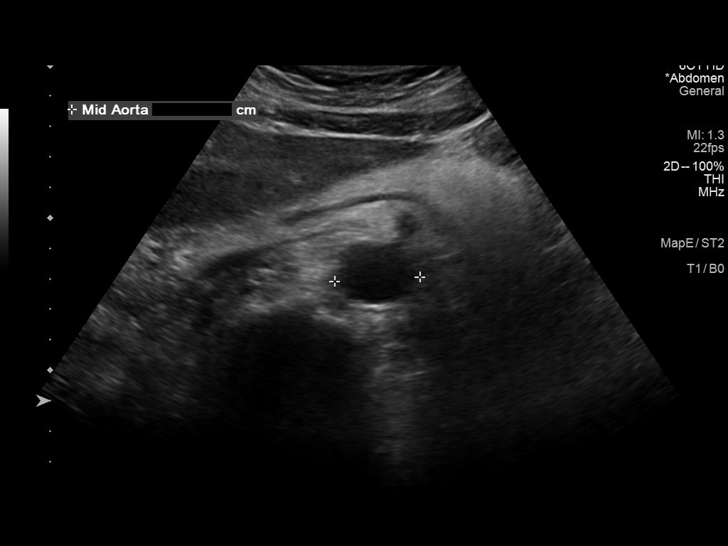
[im 9/14]
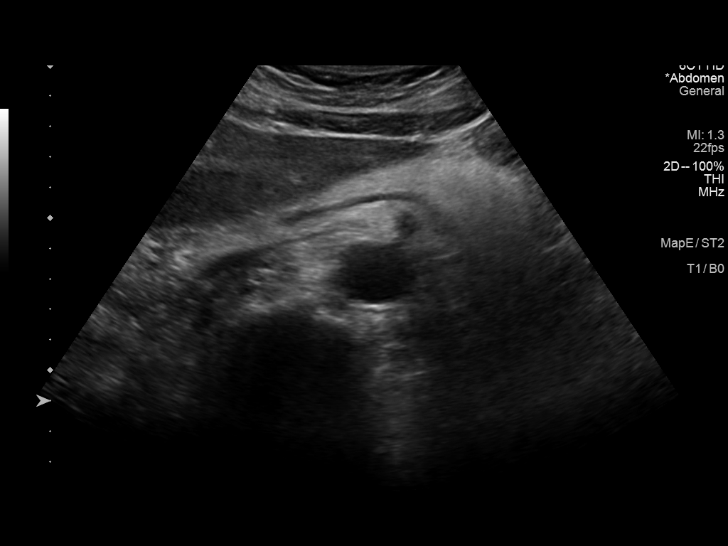
[im 10/14]
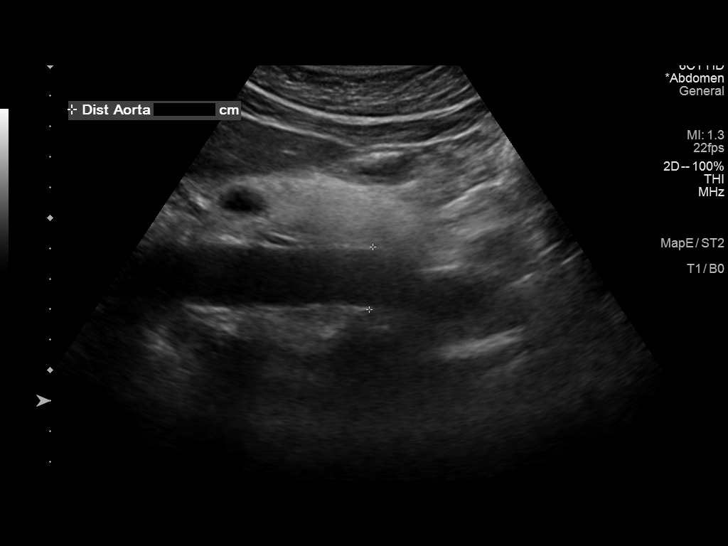
[im 11/14]
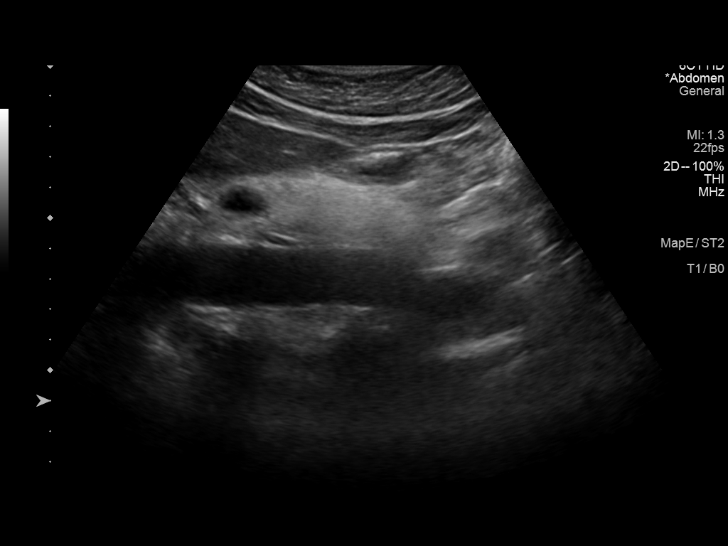
[im 12/14]
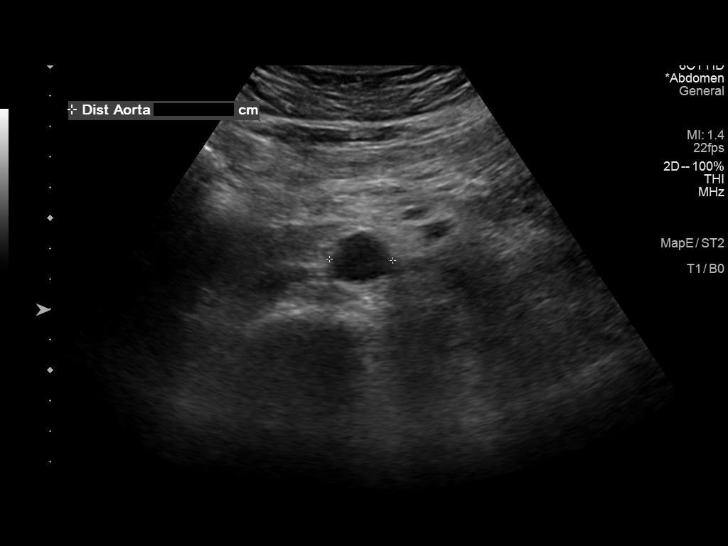
[im 13/14]
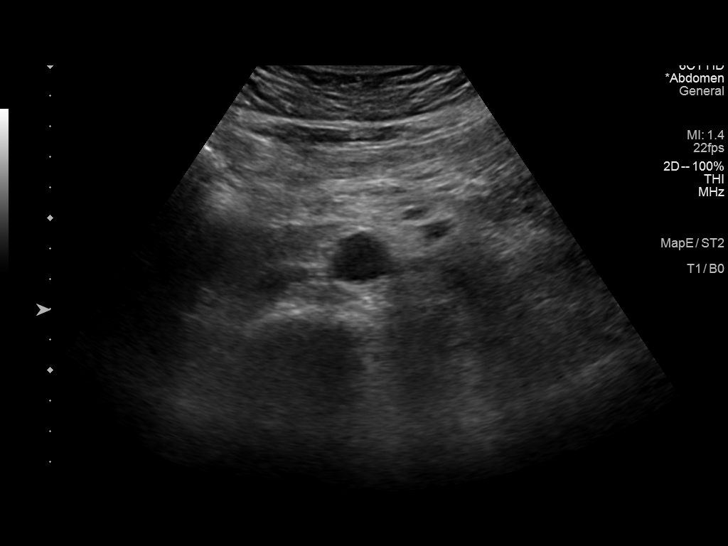
[im 14/14]
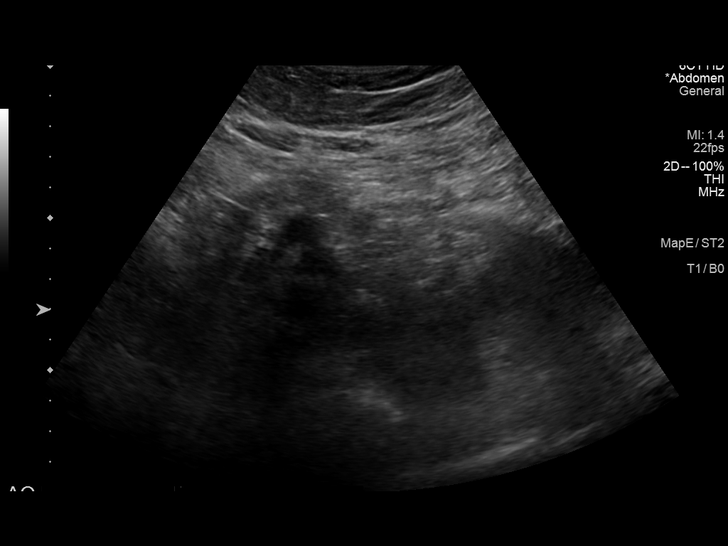

[14 of 14 positions shown; findings below may reference images not displayed]

FINDINGS: Abdominal aortic measurements as follows:

Proximal:  2.7 cm

Mid:  2.8 cm

Distal:  2.1 cm
IMPRESSION: No sonographic evidence of abdominal aortic aneurysm.

## 2020-08-10 NOTE — Procedures (Signed)
Lumbosacral Transforaminal Epidural Steroid Injection - Sub-Pedicular Approach with Fluoroscopic Guidance  Patient: Gary Camacho      Date of Birth: 12-Jan-1951 MRN: [id number] PCP: Catha Gosselin, MD      Visit Date: 07/09/2020   Universal Protocol:    Date/Time: 07/09/2020  Consent Given By: the patient  Position: PRONE  Additional Comments: Vital signs were monitored before and after the procedure. Patient was prepped and draped in the usual sterile fashion. The correct patient, procedure, and site was verified.   Injection Procedure Details:   Procedure diagnoses:  1. Lumbar radiculopathy      Meds Administered:  Meds ordered this encounter  Medications  . methylPREDNISolone acetate (DEPO-MEDROL) injection 80 mg    Laterality: Left  Location/Site:  L5-S1  Needle size: 22 G  Needle type: Spinal  Needle Placement: Transforaminal  Findings:    -Comments: Excellent flow of contrast along the nerve, nerve root and into the epidural space.  Procedure Details: After squaring off the end-plates to get a true AP view, the C-arm was positioned so that an oblique view of the foramen as noted above was visualized. The target area is just inferior to the "nose of the scotty dog" or sub pedicular. The soft tissues overlying this structure were infiltrated with 2-3 ml. of 1% Lidocaine without Epinephrine.  The spinal needle was inserted toward the target using a "trajectory" view along the fluoroscope beam.  Under AP and lateral visualization, the needle was advanced so it did not puncture dura and was located close the 6 O'Clock position of the pedical in AP tracterory. Biplanar projections were used to confirm position. Aspiration was confirmed to be negative for CSF and/or blood. A 1-2 ml. volume of Isovue-250 was injected and flow of contrast was noted at each level. Radiographs were obtained for documentation purposes.   After attaining the desired flow of contrast  documented above, a 0.5 to 1.0 ml test dose of 0.25% Marcaine was injected into each respective transforaminal space.  The patient was observed for 90 seconds post injection.  After no sensory deficits were reported, and normal lower extremity motor function was noted,   the above injectate was administered so that equal amounts of the injectate were placed at each foramen (level) into the transforaminal epidural space.   Additional Comments:  The patient tolerated the procedure well Dressing: 2 x 2 sterile gauze and Band-Aid    Post-procedure details: Patient was observed during the procedure. Post-procedure instructions were reviewed.  Patient left the clinic in stable condition.

## 2020-08-10 NOTE — Progress Notes (Signed)
Gary Camacho - 69 y.o. male MRN [id number]  Date of birth: [birth year]  Office Visit Note: Visit Date: 07/09/2020 PCP: Catha Gosselin, MD Referred by: Catha Gosselin, MD  Subjective: Chief Complaint  Patient presents with  . Lower Back - Pain  . Left Leg - Pain   HPI:  Gary Camacho is a 69 y.o. male who comes in today at the request of Dr. Burnard Bunting for planned Left L5-S1 Lumbar epidural steroid injection with fluoroscopic guidance.  The patient has failed conservative care including home exercise, medications, time and activity modification.  This injection will be diagnostic and hopefully therapeutic.  Please see requesting physician notes for further details and justification.  MRI reviewed with images and spine model.  MRI reviewed in the note below.  I have seen the patient in the remote past for facet joint block I believe that was beneficial many years ago.  He is recently seen Dr. Coletta Memos at [facility] who recommended surgery at L4-5.  MRI does show lateral recess narrowing at L4-5.  ROS Otherwise per HPI.  Assessment & Plan: Visit Diagnoses:  1. Lumbar radiculopathy   2. Stenosis of lateral recess of lumbar spine     Plan: No additional findings.   Meds & Orders:  Meds ordered this encounter  Medications  . methylPREDNISolone acetate (DEPO-MEDROL) injection 80 mg    Orders Placed This Encounter  Procedures  . XR C-ARM NO REPORT  . Epidural Steroid injection    Follow-up: Return if symptoms worsen or fail to improve.   Procedures: No procedures performed  Lumbosacral Transforaminal Epidural Steroid Injection - Sub-Pedicular Approach with Fluoroscopic Guidance  Patient: Gary Camacho      Date of Birth: Feb 10, 1951 MRN: [id number] PCP: Catha Gosselin, MD      Visit Date: 07/09/2020   Universal Protocol:    Date/Time: 07/09/2020  Consent Given By: the patient  Position: PRONE  Additional Comments: Vital signs were  monitored before and after the procedure. Patient was prepped and draped in the usual sterile fashion. The correct patient, procedure, and site was verified.   Injection Procedure Details:   Procedure diagnoses:  1. Lumbar radiculopathy      Meds Administered:  Meds ordered this encounter  Medications  . methylPREDNISolone acetate (DEPO-MEDROL) injection 80 mg    Laterality: Left  Location/Site:  L5-S1  Needle size: 22 G  Needle type: Spinal  Needle Placement: Transforaminal  Findings:    -Comments: Excellent flow of contrast along the nerve, nerve root and into the epidural space.  Procedure Details: After squaring off the end-plates to get a true AP view, the C-arm was positioned so that an oblique view of the foramen as noted above was visualized. The target area is just inferior to the "nose of the scotty dog" or sub pedicular. The soft tissues overlying this structure were infiltrated with 2-3 ml. of 1% Lidocaine without Epinephrine.  The spinal needle was inserted toward the target using a "trajectory" view along the fluoroscope beam.  Under AP and lateral visualization, the needle was advanced so it did not puncture dura and was located close the 6 O'Clock position of the pedical in AP tracterory. Biplanar projections were used to confirm position. Aspiration was confirmed to be negative for CSF and/or blood. A 1-2 ml. volume of Isovue-250 was injected and flow of contrast was noted at each level. Radiographs were obtained for documentation purposes.   After attaining the desired flow of contrast documented  above, a 0.5 to 1.0 ml test dose of 0.25% Marcaine was injected into each respective transforaminal space.  The patient was observed for 90 seconds post injection.  After no sensory deficits were reported, and normal lower extremity motor function was noted,   the above injectate was administered so that equal amounts of the injectate were placed at each foramen (level)  into the transforaminal epidural space.   Additional Comments:  The patient tolerated the procedure well Dressing: 2 x 2 sterile gauze and Band-Aid    Post-procedure details: Patient was observed during the procedure. Post-procedure instructions were reviewed.  Patient left the clinic in stable condition.      Clinical History: MRI LUMBAR SPINE WITHOUT CONTRAST   Technique: Multiplanar and multiecho pulse sequences of the lumbar spine were obtained without intravenous contrast.   Comparison: Lumbar spine radiographs 05/25/2012. CT of the abdomen and pelvis 05/24/2012.   Findings: Normal signal is present in the conus medullaris which terminates L1. Minimal anterolisthesis at L4-5 is slightly more prominent than on the prior studies. Alignment is otherwise anatomic. Vertebral body heights and marrow signal are normal. Limited imaging of the abdomen is unremarkable.   Mild facet hypertrophy is present at L1-2 and L2-3 without significant stenoses.   L3-4: A mild broad-based disc herniation is present. An annular tear is suggested centrally. Mild to moderate facet hypertrophy is worse on the right. This results in some distortion of the central canal and foramina without significant stenoses.   L4-5: Moderate facet hypertrophy is present bilaterally. There is some uncovering of a broad-based disc herniation with the anterolisthesis. A central annular tear is present. This leads to mild to moderate lateral recess narrowing, worse on the right. Foraminal stenosis is mild bilaterally.   L5-S1: Mild disc bulging is present. There is no significant stenosis.   IMPRESSION:   1. Mild to moderate lateral recess narrowing at L4-5 is worse on the right. 2. Mild foraminal narrowing bilaterally at L4-5. 3. Broad-based disc herniation, annular tear, and facet hypertrophy at L3-4 without significant stenosis. 4. Mild disc bulging at L5-S1 without significant stenosis. 5. Mild  facet hypertrophy and L1-2 and L2-3 without significant stenosis.     Original Report Authenticated By: Marin Roberts, M.D.     Objective:  VS:  HT:    WT:   BMI:     BP:(!) 142/96  HR:87bpm  TEMP: ( )  RESP:  Physical Exam Constitutional:      General: He is not in acute distress.    Appearance: Normal appearance. He is not ill-appearing.  HENT:     Head: Normocephalic and atraumatic.     Right Ear: External ear normal.     Left Ear: External ear normal.  Eyes:     Extraocular Movements: Extraocular movements intact.  Cardiovascular:     Rate and Rhythm: Normal rate.     Pulses: Normal pulses.  Abdominal:     General: There is no distension.     Palpations: Abdomen is soft.  Musculoskeletal:        General: No tenderness or signs of injury.     Right lower leg: No edema.     Left lower leg: No edema.     Comments: Patient has good distal strength without clonus.  Skin:    Findings: No erythema or rash.  Neurological:     General: No focal deficit present.     Mental Status: He is alert and oriented to person, place, and time.  Sensory: No sensory deficit.     Motor: No weakness or abnormal muscle tone.     Coordination: Coordination normal.  Psychiatric:        Mood and Affect: Mood normal.        Behavior: Behavior normal.      Imaging: No results found.
